# Patient Record
Sex: Female | Born: 1935 | Race: White | Hispanic: No | Marital: Married | Smoking: Never smoker
Health system: Southern US, Community
[De-identification: ages and names within clinical notes are randomized; demographics above are authoritative.]

## PROBLEM LIST (undated history)

## (undated) DIAGNOSIS — I10 Essential (primary) hypertension: Secondary | ICD-10-CM

## (undated) DIAGNOSIS — E119 Type 2 diabetes mellitus without complications: Secondary | ICD-10-CM

## (undated) DIAGNOSIS — E079 Disorder of thyroid, unspecified: Secondary | ICD-10-CM

## (undated) HISTORY — PX: TONSILLECTOMY AND ADENOIDECTOMY: SHX28

## (undated) HISTORY — PX: CATARACT EXTRACTION: SUR2

---

## 1997-11-18 ENCOUNTER — Other Ambulatory Visit: Admission: RE | Admit: 1997-11-18 | Discharge: 1997-11-18 | Payer: Self-pay | Admitting: Family Medicine

## 1998-03-15 ENCOUNTER — Ambulatory Visit (HOSPITAL_COMMUNITY): Admission: RE | Admit: 1998-03-15 | Discharge: 1998-03-15 | Payer: Self-pay | Admitting: Gastroenterology

## 1998-08-25 ENCOUNTER — Ambulatory Visit (HOSPITAL_COMMUNITY): Admission: RE | Admit: 1998-08-25 | Discharge: 1998-08-25 | Payer: Self-pay | Admitting: Family Medicine

## 1998-08-25 ENCOUNTER — Encounter: Payer: Self-pay | Admitting: Family Medicine

## 1998-12-08 ENCOUNTER — Other Ambulatory Visit: Admission: RE | Admit: 1998-12-08 | Discharge: 1998-12-08 | Payer: Self-pay | Admitting: Family Medicine

## 1999-11-30 ENCOUNTER — Encounter: Admission: RE | Admit: 1999-11-30 | Discharge: 1999-11-30 | Payer: Self-pay | Admitting: Family Medicine

## 1999-11-30 ENCOUNTER — Encounter: Payer: Self-pay | Admitting: Family Medicine

## 1999-12-05 ENCOUNTER — Other Ambulatory Visit: Admission: RE | Admit: 1999-12-05 | Discharge: 1999-12-05 | Payer: Self-pay | Admitting: Family Medicine

## 2000-12-02 ENCOUNTER — Encounter: Payer: Self-pay | Admitting: Family Medicine

## 2000-12-02 ENCOUNTER — Encounter: Admission: RE | Admit: 2000-12-02 | Discharge: 2000-12-02 | Payer: Self-pay | Admitting: Family Medicine

## 2000-12-20 ENCOUNTER — Other Ambulatory Visit: Admission: RE | Admit: 2000-12-20 | Discharge: 2000-12-20 | Payer: Self-pay | Admitting: Family Medicine

## 2001-12-18 ENCOUNTER — Encounter: Admission: RE | Admit: 2001-12-18 | Discharge: 2001-12-18 | Payer: Self-pay | Admitting: Family Medicine

## 2001-12-18 ENCOUNTER — Encounter: Payer: Self-pay | Admitting: Family Medicine

## 2002-01-16 ENCOUNTER — Other Ambulatory Visit: Admission: RE | Admit: 2002-01-16 | Discharge: 2002-01-16 | Payer: Self-pay | Admitting: Family Medicine

## 2002-06-26 ENCOUNTER — Ambulatory Visit (HOSPITAL_COMMUNITY): Admission: RE | Admit: 2002-06-26 | Discharge: 2002-06-26 | Payer: Self-pay | Admitting: Gastroenterology

## 2002-06-26 ENCOUNTER — Encounter (INDEPENDENT_AMBULATORY_CARE_PROVIDER_SITE_OTHER): Payer: Self-pay | Admitting: *Deleted

## 2002-12-22 ENCOUNTER — Encounter: Payer: Self-pay | Admitting: Family Medicine

## 2002-12-22 ENCOUNTER — Encounter: Admission: RE | Admit: 2002-12-22 | Discharge: 2002-12-22 | Payer: Self-pay | Admitting: Family Medicine

## 2003-02-03 ENCOUNTER — Other Ambulatory Visit: Admission: RE | Admit: 2003-02-03 | Discharge: 2003-02-03 | Payer: Self-pay | Admitting: Family Medicine

## 2003-12-30 ENCOUNTER — Encounter: Admission: RE | Admit: 2003-12-30 | Discharge: 2003-12-30 | Payer: Self-pay | Admitting: Family Medicine

## 2005-01-07 ENCOUNTER — Encounter: Admission: RE | Admit: 2005-01-07 | Discharge: 2005-01-07 | Payer: Self-pay | Admitting: Orthopedic Surgery

## 2005-02-22 ENCOUNTER — Encounter: Admission: RE | Admit: 2005-02-22 | Discharge: 2005-02-22 | Payer: Self-pay | Admitting: Family Medicine

## 2005-03-14 ENCOUNTER — Encounter: Admission: RE | Admit: 2005-03-14 | Discharge: 2005-03-14 | Payer: Self-pay | Admitting: Family Medicine

## 2005-10-03 ENCOUNTER — Encounter: Admission: RE | Admit: 2005-10-03 | Discharge: 2005-10-03 | Payer: Self-pay | Admitting: Family Medicine

## 2006-02-28 ENCOUNTER — Encounter: Admission: RE | Admit: 2006-02-28 | Discharge: 2006-02-28 | Payer: Self-pay | Admitting: Family Medicine

## 2006-07-24 ENCOUNTER — Other Ambulatory Visit: Admission: RE | Admit: 2006-07-24 | Discharge: 2006-07-24 | Payer: Self-pay | Admitting: Family Medicine

## 2007-03-28 ENCOUNTER — Encounter: Admission: RE | Admit: 2007-03-28 | Discharge: 2007-03-28 | Payer: Self-pay | Admitting: Family Medicine

## 2008-02-03 ENCOUNTER — Encounter: Admission: RE | Admit: 2008-02-03 | Discharge: 2008-02-03 | Payer: Self-pay | Admitting: Family Medicine

## 2008-03-29 ENCOUNTER — Encounter: Admission: RE | Admit: 2008-03-29 | Discharge: 2008-03-29 | Payer: Self-pay | Admitting: Family Medicine

## 2008-04-06 ENCOUNTER — Encounter: Admission: RE | Admit: 2008-04-06 | Discharge: 2008-04-06 | Payer: Self-pay | Admitting: Family Medicine

## 2009-05-03 ENCOUNTER — Encounter: Admission: RE | Admit: 2009-05-03 | Discharge: 2009-05-03 | Payer: Self-pay | Admitting: Family Medicine

## 2010-03-26 ENCOUNTER — Encounter: Payer: Self-pay | Admitting: Family Medicine

## 2010-05-04 ENCOUNTER — Other Ambulatory Visit: Payer: Self-pay | Admitting: Family Medicine

## 2010-05-04 DIAGNOSIS — Z1231 Encounter for screening mammogram for malignant neoplasm of breast: Secondary | ICD-10-CM

## 2010-05-11 ENCOUNTER — Ambulatory Visit
Admission: RE | Admit: 2010-05-11 | Discharge: 2010-05-11 | Disposition: A | Discharge status: Home / Self Care | Payer: Medicare Other | Source: Ambulatory Visit | Attending: *Deleted | Admitting: *Deleted

## 2010-05-11 DIAGNOSIS — Z1231 Encounter for screening mammogram for malignant neoplasm of breast: Secondary | ICD-10-CM

## 2010-07-21 NOTE — Op Note (Signed)
Rhonda Singh, Rhonda Singh                         ACCOUNT NO.:  [id number]   MEDICAL RECORD NO.:  [id number]                   PATIENT TYPE:  AMB   LOCATION:  ENDO                                 FACILITY:  [facility]   PHYSICIAN:  Petra Kuba, M.D.                 DATE OF BIRTH:  1930-04-05   DATE OF PROCEDURE:  06/26/2002  DATE OF DISCHARGE:                                 OPERATIVE REPORT   PROCEDURE:  Colonoscopy and polypectomy.   INDICATIONS:  History of colon cancer.  Due for repeat screening.  Consent  was signed after risks, benefits, methods, options were thoroughly discussed  in the past.   PREMEDICATIONS:  Demerol 80 mg, Versed 8 mg.   DESCRIPTION OF PROCEDURE:  Rectal inspection pertinent for external  hemorrhoids, small.  Digital exam was negative.  Pediatric video adjustable  colonoscope was inserted and fairly easily advanced around the colon to the  cecum.  This did require rolling her on her back and some abdominal  pressure.  No obvious abnormality was seen on insertion.  The cecum  was  identified by the appendiceal orifice and the ileocecal valve.  The scope  was slowly withdrawn.  Prep was adequate.  There was some liquid stool that  required washing and suctioning.  On slow withdrawal through the colon in  the ascending and hepatic flexure two edematous folds were seen.  Just to  make sure there were no polyps, cold biopsies of both were obtained and put  in the same container.  Scope was slowly withdrawn.  In the proximal  descending, a tiny polyp was seen and was hot biopsied x1, put in a second  container.  Other than some occasional left-sided diverticula, no additional  polyps were seen and scope was slowly withdrawn back to the rectum.  Once  back in the rectum, the scope was retroflexed, pertinent for some internal  hemorrhoids.  The scope was straightened and readvanced a short ways up the  left side of the colon.  Air was suctioned and the scope removed.   The  patient tolerated the procedure well.  There was no obvious immediate  complication.   ENDOSCOPIC DIAGNOSES:  1. Internal and external hemorrhoids.  2. Left occasional diverticula.  3. Tiny small descending polyp, hot biopsied.  4. Two edematous folds in the ascending and hepatic flexure, doubt polyps,     cold biopsied just to be sure.  5. Otherwise within normal limits to the cecum.    PLAN:  Await pathology.  If any of the edematous folds are adenomatous  tissue, will need colonoscopy earlier.  Otherwise recheck in five years if  doing well medically.  Have to see back p.r.n.  Probably use a regular scope  next time and return care to Dr. Dorothe Pea for customary health care  maintenance to include yearly rectals and guaiacs.  Petra Kuba, M.D.    MEM/MEDQ  D:  06/26/2002  T:  06/28/2002  Job:  [id number]   cc:   Jethro Bastos, M.D.  [address]  [place]  [place] [address]  Fax: [phone]

## 2011-05-15 ENCOUNTER — Other Ambulatory Visit: Payer: Self-pay | Admitting: Family Medicine

## 2011-05-15 DIAGNOSIS — Z1231 Encounter for screening mammogram for malignant neoplasm of breast: Secondary | ICD-10-CM

## 2011-05-29 ENCOUNTER — Ambulatory Visit
Admission: RE | Admit: 2011-05-29 | Discharge: 2011-05-29 | Disposition: A | Discharge status: Home / Self Care | Payer: Medicare Other | Source: Ambulatory Visit | Attending: Family Medicine | Admitting: Family Medicine

## 2011-05-29 DIAGNOSIS — Z1231 Encounter for screening mammogram for malignant neoplasm of breast: Secondary | ICD-10-CM

## 2012-05-08 ENCOUNTER — Other Ambulatory Visit: Payer: Self-pay

## 2012-05-08 DIAGNOSIS — Z1231 Encounter for screening mammogram for malignant neoplasm of breast: Secondary | ICD-10-CM

## 2012-06-03 ENCOUNTER — Ambulatory Visit
Admission: RE | Admit: 2012-06-03 | Discharge: 2012-06-03 | Disposition: A | Discharge status: Home / Self Care | Payer: Medicare Other | Source: Ambulatory Visit

## 2012-06-03 DIAGNOSIS — Z1231 Encounter for screening mammogram for malignant neoplasm of breast: Secondary | ICD-10-CM

## 2012-09-11 ENCOUNTER — Emergency Department (HOSPITAL_COMMUNITY): Payer: Medicare Other

## 2012-09-11 ENCOUNTER — Inpatient Hospital Stay (HOSPITAL_COMMUNITY)
Admission: EM | Admit: 2012-09-11 | Discharge: 2012-09-15 | DRG: 872 | Disposition: A | Discharge status: Home / Self Care | Payer: Medicare Other | Attending: Internal Medicine | Admitting: Internal Medicine

## 2012-09-11 ENCOUNTER — Encounter (HOSPITAL_COMMUNITY): Payer: Self-pay | Admitting: Emergency Medicine

## 2012-09-11 DIAGNOSIS — A419 Sepsis, unspecified organism: Principal | ICD-10-CM | POA: Diagnosis present

## 2012-09-11 DIAGNOSIS — I10 Essential (primary) hypertension: Secondary | ICD-10-CM | POA: Diagnosis present

## 2012-09-11 DIAGNOSIS — E785 Hyperlipidemia, unspecified: Secondary | ICD-10-CM | POA: Diagnosis present

## 2012-09-11 DIAGNOSIS — E119 Type 2 diabetes mellitus without complications: Secondary | ICD-10-CM | POA: Diagnosis present

## 2012-09-11 DIAGNOSIS — Z8679 Personal history of other diseases of the circulatory system: Secondary | ICD-10-CM

## 2012-09-11 DIAGNOSIS — I959 Hypotension, unspecified: Secondary | ICD-10-CM | POA: Diagnosis present

## 2012-09-11 DIAGNOSIS — N390 Urinary tract infection, site not specified: Secondary | ICD-10-CM | POA: Diagnosis present

## 2012-09-11 DIAGNOSIS — E039 Hypothyroidism, unspecified: Secondary | ICD-10-CM | POA: Diagnosis present

## 2012-09-11 DIAGNOSIS — R009 Unspecified abnormalities of heart beat: Secondary | ICD-10-CM | POA: Diagnosis present

## 2012-09-11 DIAGNOSIS — E669 Obesity, unspecified: Secondary | ICD-10-CM | POA: Diagnosis present

## 2012-09-11 DIAGNOSIS — N179 Acute kidney failure, unspecified: Secondary | ICD-10-CM | POA: Diagnosis present

## 2012-09-11 DIAGNOSIS — E876 Hypokalemia: Secondary | ICD-10-CM | POA: Diagnosis not present

## 2012-09-11 DIAGNOSIS — A498 Other bacterial infections of unspecified site: Secondary | ICD-10-CM | POA: Diagnosis present

## 2012-09-11 DIAGNOSIS — E860 Dehydration: Secondary | ICD-10-CM | POA: Diagnosis present

## 2012-09-11 HISTORY — DX: Disorder of thyroid, unspecified: E07.9

## 2012-09-11 HISTORY — DX: Essential (primary) hypertension: I10

## 2012-09-11 LAB — CBC WITH DIFFERENTIAL/PLATELET
Basophils Relative: 0 % (ref 0–1)
Eosinophils Absolute: 0 10*3/uL (ref 0.0–0.7)
Eosinophils Relative: 0 % (ref 0–5)
Lymphs Abs: 0.4 10*3/uL — ABNORMAL LOW (ref 0.7–4.0)
MCH: 32.6 pg (ref 26.0–34.0)
MCHC: 33.9 g/dL (ref 30.0–36.0)
MCV: 96.1 fL (ref 78.0–100.0)
Monocytes Relative: 4 % (ref 3–12)
Neutrophils Relative %: 89 % — ABNORMAL HIGH (ref 43–77)
Platelets: 148 10*3/uL — ABNORMAL LOW (ref 150–400)
RBC: 3.56 MIL/uL — ABNORMAL LOW (ref 3.87–5.11)

## 2012-09-11 LAB — URINE MICROSCOPIC-ADD ON

## 2012-09-11 LAB — COMPREHENSIVE METABOLIC PANEL
Albumin: 2.8 g/dL — ABNORMAL LOW (ref 3.5–5.2)
BUN: 23 mg/dL (ref 6–23)
Calcium: 8.8 mg/dL (ref 8.4–10.5)
GFR calc Af Amer: 52 mL/min — ABNORMAL LOW (ref >90)
Glucose, Bld: 163 mg/dL — ABNORMAL HIGH (ref 70–99)
Potassium: 3.3 mEq/L — ABNORMAL LOW (ref 3.5–5.1)
Sodium: 142 mEq/L (ref 135–145)
Total Protein: 5.5 g/dL — ABNORMAL LOW (ref 6.0–8.3)

## 2012-09-11 LAB — PROCALCITONIN: Procalcitonin: 0.42 ng/mL

## 2012-09-11 LAB — URINALYSIS, ROUTINE W REFLEX MICROSCOPIC
Nitrite: POSITIVE — AB
Specific Gravity, Urine: 1.016 (ref 1.005–1.030)
Urobilinogen, UA: 1 mg/dL (ref 0.0–1.0)
pH: 6 (ref 5.0–8.0)

## 2012-09-11 MED ORDER — ENOXAPARIN SODIUM 40 MG/0.4ML ~~LOC~~ SOLN
40.0000 mg | SUBCUTANEOUS | Status: DC
  Administered 2012-09-11 – 2012-09-14 (×4): 40 mg via SUBCUTANEOUS
  Filled 2012-09-11 (×5): qty 0.4

## 2012-09-11 MED ORDER — SODIUM CHLORIDE 0.9 % IV SOLN
1000.0000 mL | Freq: Once | INTRAVENOUS | Status: AC
  Administered 2012-09-11: 1000 mL via INTRAVENOUS

## 2012-09-11 MED ORDER — INSULIN ASPART 100 UNIT/ML ~~LOC~~ SOLN: 0.0000 [IU] | Freq: Every day | SUBCUTANEOUS | Status: DC

## 2012-09-11 MED ORDER — DEXTROSE 5 % IV SOLN
1.0000 g | INTRAVENOUS | Status: DC
  Administered 2012-09-12 – 2012-09-14 (×3): 1 g via INTRAVENOUS
  Filled 2012-09-11 (×4): qty 10

## 2012-09-11 MED ORDER — ACETAMINOPHEN 325 MG PO TABS
650.0000 mg | ORAL_TABLET | Freq: Four times a day (QID) | ORAL | Status: DC | PRN
  Administered 2012-09-14: 650 mg via ORAL
  Filled 2012-09-11: qty 2

## 2012-09-11 MED ORDER — LEVOTHYROXINE SODIUM 100 MCG PO TABS
100.0000 ug | ORAL_TABLET | Freq: Every day | ORAL | Status: DC
  Administered 2012-09-12 – 2012-09-15 (×4): 100 ug via ORAL
  Filled 2012-09-11 (×5): qty 1

## 2012-09-11 MED ORDER — ONDANSETRON HCL 4 MG/2ML IJ SOLN: 4.0000 mg | Freq: Four times a day (QID) | INTRAMUSCULAR | Status: DC | PRN

## 2012-09-11 MED ORDER — SODIUM CHLORIDE 0.9 % IV SOLN
1000.0000 mL | INTRAVENOUS | Status: DC
  Administered 2012-09-11 – 2012-09-12 (×3): 1000 mL via INTRAVENOUS

## 2012-09-11 MED ORDER — SODIUM CHLORIDE 0.9 % IV SOLN: 1000.0000 mL | Freq: Once | INTRAVENOUS | Status: AC

## 2012-09-11 MED ORDER — DEXTROSE 5 % IV SOLN: 1.0000 g | INTRAVENOUS | Status: DC

## 2012-09-11 MED ORDER — SODIUM CHLORIDE 0.9 % IJ SOLN
3.0000 mL | Freq: Two times a day (BID) | INTRAMUSCULAR | Status: DC
  Administered 2012-09-11 – 2012-09-14 (×5): 3 mL via INTRAVENOUS

## 2012-09-11 MED ORDER — DEXTROSE 5 % IV SOLN
2.0000 g | Freq: Once | INTRAVENOUS | Status: AC
  Administered 2012-09-11: 2 g via INTRAVENOUS
  Filled 2012-09-11 (×2): qty 2

## 2012-09-11 MED ORDER — POTASSIUM CHLORIDE CRYS ER 20 MEQ PO TBCR
40.0000 meq | EXTENDED_RELEASE_TABLET | Freq: Every day | ORAL | Status: DC
  Administered 2012-09-12: 40 meq via ORAL
  Filled 2012-09-11 (×2): qty 2

## 2012-09-11 MED ORDER — ACETAMINOPHEN 325 MG PO TABS
650.0000 mg | ORAL_TABLET | Freq: Once | ORAL | Status: AC
  Administered 2012-09-11: 650 mg via ORAL
  Filled 2012-09-11: qty 2

## 2012-09-11 MED ORDER — ACETAMINOPHEN 650 MG RE SUPP: 650.0000 mg | Freq: Four times a day (QID) | RECTAL | Status: DC | PRN

## 2012-09-11 MED ORDER — INSULIN ASPART 100 UNIT/ML ~~LOC~~ SOLN
0.0000 [IU] | Freq: Three times a day (TID) | SUBCUTANEOUS | Status: DC
  Administered 2012-09-12: 3 [IU] via SUBCUTANEOUS

## 2012-09-11 MED ORDER — ASPIRIN EC 81 MG PO TBEC
81.0000 mg | DELAYED_RELEASE_TABLET | Freq: Every day | ORAL | Status: DC
  Administered 2012-09-11 – 2012-09-14 (×4): 81 mg via ORAL
  Filled 2012-09-11 (×5): qty 1

## 2012-09-11 MED ORDER — ONDANSETRON HCL 4 MG PO TABS: 4.0000 mg | ORAL_TABLET | Freq: Four times a day (QID) | ORAL | Status: DC | PRN

## 2012-09-11 MED ORDER — DOCUSATE SODIUM 100 MG PO CAPS
100.0000 mg | ORAL_CAPSULE | Freq: Two times a day (BID) | ORAL | Status: DC
  Filled 2012-09-11 (×9): qty 1

## 2012-09-11 MED ORDER — EZETIMIBE 10 MG PO TABS
10.0000 mg | ORAL_TABLET | Freq: Every day | ORAL | Status: DC
  Administered 2012-09-11 – 2012-09-14 (×4): 10 mg via ORAL
  Filled 2012-09-11 (×5): qty 1

## 2012-09-11 MED ORDER — SODIUM CHLORIDE 0.9 % IV BOLUS (SEPSIS)
1000.0000 mL | Freq: Once | INTRAVENOUS | Status: AC
  Administered 2012-09-11: 1000 mL via INTRAVENOUS

## 2012-09-11 MED ORDER — ATORVASTATIN CALCIUM 40 MG PO TABS
40.0000 mg | ORAL_TABLET | Freq: Every day | ORAL | Status: DC
  Administered 2012-09-11 – 2012-09-14 (×4): 40 mg via ORAL
  Filled 2012-09-11 (×5): qty 1

## 2012-09-11 NOTE — ED Notes (Signed)
Returned from xray

## 2012-09-11 NOTE — ED Notes (Signed)
Pt arrives from home via EMS.  Pt reports sudden onset of chills and shaking, feeling freezing.  EMS states pt was hot to touch. 18g left ac, EMS gave 500ns bolus.  EMS tempanic temp 104.0. Hr 120s but rerturned to 100 after fluid bolus, bp 120/60.  Pt alert oriented X4

## 2012-09-11 NOTE — ED Notes (Signed)
Daughter-- Osborne Oman-- HOME --[phone]  Cell-- [phone]

## 2012-09-11 NOTE — H&P (Signed)
[facility] History and Physical  MAKINZEY BANES AVW:[id number] DOB: 10/20/1930    PCP:   None  Chief Complaint: Rigors.  HPI: Rhonda Singh is an 79 y.o. female with hx of HTN on meds, hypothyroidism on supplements, obesity, presents to the ER with chills and rigors for one day.  She was having dysuria a few days ago, and has been taking OTC. Today, however, there were no dysuria, N/V, shortness of breath or chest pain, flank pain, hematuria or any other symptomology except chills and rigors.  Evaluation in the ER showed UA with TNTC WBC, with normal WBC and normal renal fx tests.  Her K is a little low at 3.3.  PCCM was consulted and hospitalist was asked to admit her for urosepsis.  She responded to some IVF bolus to a SBP of 88.  She mentated well and has no sign of peripheral hypoperfusion.  Rewiew of Systems:  Constitutional: Negative for malaise.  No significant weight loss or weight gain Eyes: Negative for eye pain, redness and discharge, diplopia, visual changes, or flashes of light. ENMT: Negative for ear pain, hoarseness, nasal congestion, sinus pressure and sore throat. No headaches; tinnitus, drooling, or problem swallowing. Cardiovascular: Negative for chest pain, palpitations, diaphoresis, dyspnea and peripheral edema. ; No orthopnea, PND Respiratory: Negative for cough, hemoptysis, wheezing and stridor. No pleuritic chestpain. Gastrointestinal: Negative for nausea, vomiting, diarrhea, constipation, abdominal pain, melena, blood in stool, hematemesis, jaundice and rectal bleeding.    Genitourinary: Negative for frequency, dysuria, incontinence,flank pain and hematuria; Musculoskeletal: Negative for back pain and neck pain. Negative for swelling and trauma.;  Skin: . Negative for pruritus, rash, abrasions, bruising and skin lesion.; ulcerations Neuro: Negative for headache, lightheadedness and neck stiffness. Negative for weakness, altered level of consciousness ,  altered mental status, extremity weakness, burning feet, involuntary movement, seizure and syncope.  Psych: negative for anxiety, depression, insomnia, tearfulness, panic attacks, hallucinations, paranoia, suicidal or homicidal ideation.   Past Medical History  Diagnosis Date  . Hypertension   . Thyroid disease     No past surgical history on file.  Medications:  HOME MEDS: Prior to Admission medications   Medication Sig Start Date End Date Taking? Authorizing Provider  aspirin EC 81 MG tablet Take 81 mg by mouth at bedtime.   Yes Historical Provider, MD  cloNIDine (CATAPRES) 0.2 MG tablet Take 0.2 mg by mouth at bedtime.   Yes Historical Provider, MD  ezetimibe (ZETIA) 10 MG tablet Take 10 mg by mouth at bedtime.   Yes Historical Provider, MD  levothyroxine (SYNTHROID, LEVOTHROID) 100 MCG tablet Take 100 mcg by mouth daily before breakfast.   Yes Historical Provider, MD  lisinopril-hydrochlorothiazide (PRINZIDE,ZESTORETIC) 20-25 MG per tablet Take 1 tablet by mouth every morning.   Yes Historical Provider, MD  Multiple Vitamin (MULTIVITAMIN WITH MINERALS) TABS Take 1 tablet by mouth daily.   Yes Historical Provider, MD  Naphazoline HCl (CLEAR EYES OP) Apply 1 drop to eye daily as needed. For dry eyes   Yes Historical Provider, MD  naproxen sodium (ANAPROX) 220 MG tablet Take 220 mg by mouth daily as needed. For pain   Yes Historical Provider, MD  rosuvastatin (CRESTOR) 20 MG tablet Take 20 mg by mouth at bedtime.   Yes Historical Provider, MD     Allergies:  No Known Allergies  Social History:   has no tobacco, alcohol, and drug history on file.  Family History: No family history on file.   Physical Exam: Filed Vitals:  09/11/12 1800 09/11/12 1815 09/11/12 1830 09/11/12 2017  BP: 87/39 95/39 82/62  92/50  Pulse: 73 83 75 68  Temp:    98.4 F (36.9 C)  TempSrc:    Oral  Resp: 20 27 21 18   SpO2: 96% 95% 97% 98%   Blood pressure 92/50, pulse 68, temperature 98.4 F  (36.9 C), temperature source Oral, resp. rate 18, SpO2 98.00%.  GEN:  Pleasant  patient lying in the stretcher in no acute distress; cooperative with exam. PSYCH:  alert and oriented x4; does not appear anxious or depressed; affect is appropriate. HEENT: Mucous membranes pink and anicteric; PERRLA; EOM intact; no cervical lymphadenopathy nor thyromegaly or carotid bruit; no JVD; There were no stridor. Neck is very supple. Breasts:: Not examined CHEST WALL: No tenderness CHEST: Normal respiration, clear to auscultation bilaterally.  HEART: Regular rate and rhythm.  There are no murmur, rub, or gallops.   BACK: No kyphosis or scoliosis; no CVA tenderness ABDOMEN: soft and non-tender; no masses, no organomegaly, normal abdominal bowel sounds; no pannus; no intertriginous candida. There is no rebound and no distention. Rectal Exam: Not done EXTREMITIES: No bone or joint deformity; age-appropriate arthropathy of the hands and knees; no edema; no ulcerations.  There is no calf tenderness. Genitalia: not examined PULSES: 2+ and symmetric SKIN: Normal hydration no rash or ulceration CNS: Cranial nerves 2-12 grossly intact no focal lateralizing neurologic deficit.  Speech is fluent; uvula elevated with phonation, facial symmetry and tongue midline. DTR are normal bilaterally, cerebella exam is intact, barbinski is negative and strengths are equaled bilaterally.  No sensory loss.   Labs on Admission:  Basic Metabolic Panel:  Recent Labs Lab 09/11/12 1539  NA 142  K 3.3*  CL 106  CO2 24  GLUCOSE 163*  BUN 23  CREATININE 1.12*  CALCIUM 8.8   Liver Function Tests:  Recent Labs Lab 09/11/12 1539  AST 27  ALT 25  ALKPHOS 54  BILITOT 0.6  PROT 5.5*  ALBUMIN 2.8*   No results found for this basename: LIPASE, AMYLASE,  in the last 168 hours No results found for this basename: AMMONIA,  in the last 168 hours CBC:  Recent Labs Lab 09/11/12 1539  WBC 5.7  NEUTROABS 5.0  HGB 11.6*   HCT 34.2*  MCV 96.1  PLT 148*   Cardiac Enzymes: No results found for this basename: CKTOTAL, CKMB, CKMBINDEX, TROPONINI,  in the last 168 hours  CBG: No results found for this basename: GLUCAP,  in the last 168 hours   Radiological Exams on Admission: Dg Chest 2 View  09/11/2012   *RADIOLOGY REPORT*  Clinical Data: History of fever and weakness.  CHEST - 2 VIEW  Comparison: 02/03/2008 study.  Findings: Cardiac silhouette is upper range of normal size. Ectasia and nonaneurysmal calcification of the thoracic aorta are seen.  No hilar enlargement is seen.  No pulmonary infiltrates or masses were evident. No pleural abnormality is evident. There is osteopenic appearance of the bones. There is minimal degenerative spondylosis.  IMPRESSION: No acute cardiopulmonary or pleural abnormalities are evident.   Original Report Authenticated By: Onalee Hua Call    Assessment/Plan Present on Admission:  . Urosepsis . Hypotension arterial . DM2 (diabetes mellitus, type 2) . Obesity . Thyroid condition . Hyperlipidemia  PLAN:  Will admit her to SDU for urosepsis due to hypotension.  Will continue with Rocephin.  BC and urine cultures have been obtain.  Will give her IVF, and Zesteretic will be discontinued.  For her DM,  will use ISS with q ac and hs CBGs.  For her hypothyroidism, will continue supplement and check TSH.  Her meds have been continued.  Thank you for allowing me to participate in the care of this pleasant patient.  Other plans as per orders.  Code Status: FULL Unk Lightning, MD. [facility] Pager [phone] 7pm to 7am.  09/11/2012, 9:00 PM

## 2012-09-11 NOTE — ED Provider Notes (Signed)
I saw and evaluated the patient, reviewed the resident's note and I agree with the findings and plan.  79yo female with history of hypertension has several days of some urinary frequency with some dysuria today of shaking chills and came to the emergency department with fever to 104 and pulse 120 prior to arrival with pulse decreasing with IV fluid bolus from EMS and systolic blood pressure in the 90s upon initial evaluation with no abdominal pain or back pain.  Transient asymptomatic hypotension with systolic pressure in the ED is in the emergency department back up to the 90s with IV fluid bolus. 1820  D/w PCCM rec stepdown admit to [facility].1830  D/w [facility] for admit. 2005  CRITICAL CARE Performed by: Hurman Horn Total critical care time: including treatment of hypotension. Critical care time was exclusive of separately billable procedures and treating other patients. Critical care was necessary to treat or prevent imminent or life-threatening deterioration. Critical care was time spent personally by me on the following activities: development of treatment plan with patient and/or surrogate as well as nursing, discussions with consultants, evaluation of patient's response to treatment, examination of patient, obtaining history from patient or surrogate, ordering and performing treatments and interventions, ordering and review of laboratory studies, ordering and review of radiographic studies, pulse oximetry and re-evaluation of patient's condition.  Auto BP cuff did not correlate with manual BP in ED; when auto SBP 79 manual SBP was 92.  Hurman Horn, MD 09/13/12 2103

## 2012-09-11 NOTE — ED Notes (Signed)
States "I felt fine this morning, and just started getting chilly this afternoon" Dr. Fonnie Jarvis in to see pt.

## 2012-09-11 NOTE — ED Provider Notes (Signed)
History    CSN: [id number] Arrival date & time 09/11/12  1433  First MD Initiated Contact with Patient 09/11/12 1446     Chief Complaint  Patient presents with  . Fever    HPI  Pt is a 79 yo Caucasian F with pmh of HTN, hypothyroidism on replacement, and HL who presents with persistent rigors that began 1 day ago. Pt reports last night she had shaking chills that resolved when she put on more clothing. She then started having similar shaking chills today during lunch time, about 3 hours ago. She feels that she is gasping for breath when having rigors. She has been having urinary burning and frequency with no blood in urine for about 1 week and has been taking OTC Urostat. She has had urinary symptoms on and off for many years and which she usually resolves when takes OTC medication. No h/o nephrolithiasis.    No CP, HA, neck stiffness, mental confusion, abdominal pain, nausea, vomiting, decreased appetite, or changes in BM.  Has chronic dry cough thought to be due to GERD, with no recent changes. Also scraped right leg in her garden over a month ago but is not infected and is resolving.     Past Medical History  Diagnosis Date  . Hypertension   . Thyroid disease    No past surgical history on file. No family history on file. History  Substance Use Topics  . Smoking status: Not on file  . Smokeless tobacco: Not on file  . Alcohol Use: Not on file   OB History   Grav Para Term Preterm Abortions TAB SAB Ect Mult Living                 Review of Systems  Constitutional: Positive for fever and chills. Negative for appetite change.  HENT: Negative for congestion and sneezing.   Respiratory: Positive for cough (chronic dry cough). Negative for shortness of breath.        Gasping when having rigors   Cardiovascular: Negative for chest pain and leg swelling.  Gastrointestinal: Negative for vomiting, abdominal pain, diarrhea and constipation.  Genitourinary: Positive for  frequency. Negative for hematuria and flank pain.       Burning on urination   Musculoskeletal: Negative for back pain.  Skin:       Scab on right LE   Neurological: Negative for headaches.  Psychiatric/Behavioral: Negative for confusion.    Allergies  Review of patient's allergies indicates no known allergies.  Home Medications   Current Outpatient Rx  Name  Route  Sig  Dispense  Refill  . aspirin EC 81 MG tablet   Oral   Take 81 mg by mouth at bedtime.         . cloNIDine (CATAPRES) 0.2 MG tablet   Oral   Take 0.2 mg by mouth at bedtime.         Marland Kitchen ezetimibe (ZETIA) 10 MG tablet   Oral   Take 10 mg by mouth at bedtime.         Marland Kitchen levothyroxine (SYNTHROID, LEVOTHROID) 100 MCG tablet   Oral   Take 100 mcg by mouth daily before breakfast.         . lisinopril-hydrochlorothiazide (PRINZIDE,ZESTORETIC) 20-25 MG per tablet   Oral   Take 1 tablet by mouth every morning.         . Multiple Vitamin (MULTIVITAMIN WITH MINERALS) TABS   Oral   Take 1 tablet by mouth daily.         Marland Kitchen  Naphazoline HCl (CLEAR EYES OP)   Ophthalmic   Apply 1 drop to eye daily as needed. For dry eyes         . naproxen sodium (ANAPROX) 220 MG tablet   Oral   Take 220 mg by mouth daily as needed. For pain         . rosuvastatin (CRESTOR) 20 MG tablet   Oral   Take 20 mg by mouth at bedtime.          BP 92/50  Pulse 68  Temp(Src) 98.4 F (36.9 C) (Oral)  Resp 18  SpO2 98% Physical Exam  Constitutional: She is oriented to person, place, and time. She appears well-developed and well-nourished. No distress.  Obese Warm to touch  HENT:  Head: Normocephalic and atraumatic.  Eyes: EOM are normal.  Neck: Normal range of motion. Neck supple.  Cardiovascular: Regular rhythm and normal heart sounds.   Tachycardic   Pulmonary/Chest: Breath sounds normal. No respiratory distress. She has no wheezes. She has no rales.  tachypneic   Abdominal: Soft. Bowel sounds are normal. She  exhibits no distension. There is no tenderness. There is no rebound, no guarding and no CVA tenderness.  Musculoskeletal: Normal range of motion. She exhibits edema (trace LE edema).  Neurological: She is alert and oriented to person, place, and time.  Skin: Skin is warm and dry. She is not diaphoretic.  Healing scab on right LE above ankle  Psychiatric: She has a normal mood and affect.    ED Course  Procedures (including critical care time) Labs Reviewed  CBC WITH DIFFERENTIAL - Abnormal; Notable for the following:    RBC 3.56 (*)    Hemoglobin 11.6 (*)    HCT 34.2 (*)    Platelets 148 (*)    Neutrophils Relative % 89 (*)    Lymphocytes Relative 7 (*)    Lymphs Abs 0.4 (*)    All other components within normal limits  COMPREHENSIVE METABOLIC PANEL - Abnormal; Notable for the following:    Potassium 3.3 (*)    Glucose, Bld 163 (*)    Creatinine, Ser 1.12 (*)    Total Protein 5.5 (*)    Albumin 2.8 (*)    GFR calc non Af Amer 45 (*)    GFR calc Af Amer 52 (*)    All other components within normal limits  URINALYSIS, ROUTINE W REFLEX MICROSCOPIC - Abnormal; Notable for the following:    Color, Urine AMBER (*)    APPearance CLOUDY (*)    Hgb urine dipstick SMALL (*)    Bilirubin Urine SMALL (*)    Protein, ur 30 (*)    Nitrite POSITIVE (*)    Leukocytes, UA LARGE (*)    All other components within normal limits  URINE MICROSCOPIC-ADD ON - Abnormal; Notable for the following:    Bacteria, UA MANY (*)    All other components within normal limits  CULTURE, BLOOD (ROUTINE X 2)  CULTURE, BLOOD (ROUTINE X 2)  URINE CULTURE  PROCALCITONIN  CG4 I-STAT (LACTIC ACID)   Dg Chest 2 View  09/11/2012   *RADIOLOGY REPORT*  Clinical Data: History of fever and weakness.  CHEST - 2 VIEW  Comparison: 02/03/2008 study.  Findings: Cardiac silhouette is upper range of normal size. Ectasia and nonaneurysmal calcification of the thoracic aorta are seen.  No hilar enlargement is seen.  No  pulmonary infiltrates or masses were evident. No pleural abnormality is evident. There is osteopenic appearance of the bones. There  is minimal degenerative spondylosis.  IMPRESSION: No acute cardiopulmonary or pleural abnormalities are evident.   Original Report Authenticated By: Onalee Hua Call   1. Sepsis   2. UTI (urinary tract infection)     MDM  Assessment:  79 yo Caucasian F with pmh of HTN, hypothyroidism on replacement, and HL who presents with rigors, initial fever of104, and urinary symptoms.   Plan:   Rigors, fever  - UTI w/ sepsis vs pyelonephritis vs nephrolithiasis  -Obtain CBC w/ diff ---> microcytic anemia  -Obtain CMP ---> Hyperglycemia, elevated Cr,  hypokalemia --> replete with PO potassium   -Obtain Procalcitonin levels --->  0.42 --> sepsis not likely, local bacterial infection is possible -Obtain lactic acid levels --> WNL -Obtain blood cultures x 2 --> pending  -Obtain UA w/ culture -->  Cloudy with pyuria, bacteriuria, nitrite+, and dipstick+, no casts  --> c/w UTI vs pyelonephritis     -Obtain CXR --> normal  -Monitor with VS with cardiac monitor & pulse oximetry  -[place] NS bolus & 159mL/hr to maintain appropiate BP -Administer Rocephin 2 g IV for suspected UTI  Disposition: Admit to inpatient medicine. Pt with sepsis (SIRS + suspected UTI/pyelonephritis). Rigors subsided, no CVA tenderness  or leukocytosis, but  was febrile, tachycardic, and hypotensive that improved with IV fluids. Automatic BP cuff readings falsely low (SBP 79 vs manual cuff SBP 92).  Was started on IV antibiotics, awaiting urine and blood cultures.                   Otis Brace, MD 09/11/12 2022

## 2012-09-11 NOTE — ED Notes (Addendum)
Low BP noted Dr. Johna Roles informed. Manual BP taken

## 2012-09-12 DIAGNOSIS — E876 Hypokalemia: Secondary | ICD-10-CM | POA: Diagnosis not present

## 2012-09-12 DIAGNOSIS — R009 Unspecified abnormalities of heart beat: Secondary | ICD-10-CM | POA: Diagnosis present

## 2012-09-12 DIAGNOSIS — E860 Dehydration: Secondary | ICD-10-CM | POA: Diagnosis present

## 2012-09-12 DIAGNOSIS — N390 Urinary tract infection, site not specified: Secondary | ICD-10-CM | POA: Diagnosis present

## 2012-09-12 LAB — GLUCOSE, CAPILLARY

## 2012-09-12 LAB — MRSA PCR SCREENING: MRSA by PCR: NEGATIVE

## 2012-09-12 MED ORDER — SODIUM CHLORIDE 0.9 % IV SOLN
1000.0000 mL | INTRAVENOUS | Status: DC
  Administered 2012-09-13: 1000 mL via INTRAVENOUS

## 2012-09-12 NOTE — Progress Notes (Signed)
Utilization Review Completed.  09/12/2012  

## 2012-09-12 NOTE — Progress Notes (Signed)
[facility] Progress Note Holiday TEAM 1 - Stepdown/ICU TEAM   Rhonda Singh ZOX:[id number] DOB: Nov 01, 1930 DOA: 09/11/2012 PCP: No primary provider on file.  Brief narrative: 79 year old female patient essentially healthy. Presented to the ER with acute onset of chills and rigors for less than 24 hours. Apparent dysuria for a few days preceding this. She had been treating this with over-the-counter medications. She had no other symptoms. In the emergency department her urinalysis was abnormal with too numerous to count white cells. Her serum white count was normal. She was hypotensive at presentation and required multiple boluses of IV fluids and at time of admission her systolic blood pressure had increased to 88. She was mentally intact and otherwise had no signs of hypoperfusion although her MAP was less than 65.  Assessment/Plan:  Sepsis / Hypotension -BP still soft so cont IVF -suspect urinary etiology -cont supportive care and follow up on all cultures  UTI (lower urinary tract infection) -culture pending -cont empiric anbx's  DM2  -CBG controlled -cont SSI -diet controlled at home  Hypokalemia -oral replete and follow up BMET in am  Dehydration -S/P 3 liters infused since admit  -cont IVF since BP soft  ARF vs CKD 3 -GFR in the 50's but baseline unknown -follow  Abnormal cardiac rythmn -having a few PVC couplets and did have a sinus pause with a dropped QRS/nonconducted P wave -follow on telemetry  History of hypertension -home Catapres and Prinizide on hold  Obesity  Hypothyroidism -continue Synthroid  Hyperlipidemia   DVT prophylaxis: Lovenox Code Status: Full Family Communication: Patient only Disposition Plan: Remain in step down 2/2 soft blood pressure and low MAP  Consultants: None  Procedures: None  Antibiotics: Rocephin 7/10 >>>  HPI/Subjective: Patient alert and denies abdominal or flank pain. No nausea or vomiting. No  dysuria.  Objective: Blood pressure 97/47, pulse 70, temperature 99.1 F (37.3 C), temperature source Oral, resp. rate 19, height 5\' 4"  (1.626 m), weight 87.9 kg (193 lb 12.6 oz), SpO2 96.00%.  Intake/Output Summary (Last 24 hours) at 09/12/12 1228 Last data filed at 09/12/12 1100  Gross per 24 hour  Intake   4750 ml  Output    750 ml  Net   4000 ml   Exam: General: No acute respiratory distress Lungs: Clear to auscultation bilaterally without wheezes or crackles, RA Cardiovascular: Mostly Regular rate and rhythm without murmur gallop or rub normal S1 and S2, no peripheral edema or JVD-patient noted with occasional unifocal PVC couplets and did have at least one isolated incidents of a dropped QRS/nonconducted P-wave Abdomen: Nontender, nondistended, soft, bowel sounds positive, no rebound, no ascites, no appreciable mass Musculoskeletal: No significant cyanosis, clubbing of bilateral lower extremities Neurological: Alert and oriented x 3, moves all extremities x 4 without focal neurological deficits, CN 2-12 intact  Scheduled Meds: Scheduled Meds: . aspirin EC  81 mg Oral QHS  . atorvastatin  40 mg Oral q1800  . cefTRIAXone (ROCEPHIN)  IV  1 g Intravenous Q24H  . docusate sodium  100 mg Oral BID  . enoxaparin (LOVENOX) injection  40 mg Subcutaneous Q24H  . ezetimibe  10 mg Oral QHS  . insulin aspart  0-15 Units Subcutaneous TID WC  . insulin aspart  0-5 Units Subcutaneous QHS  . levothyroxine  100 mcg Oral QAC breakfast  . potassium chloride  40 mEq Oral Daily  . sodium chloride  3 mL Intravenous Q12H    Data Reviewed: Basic Metabolic Panel:  Recent Labs Lab  09/11/12 1539  NA 142  K 3.3*  CL 106  CO2 24  GLUCOSE 163*  BUN 23  CREATININE 1.12*  CALCIUM 8.8   Liver Function Tests:  Recent Labs Lab 09/11/12 1539  AST 27  ALT 25  ALKPHOS 54  BILITOT 0.6  PROT 5.5*  ALBUMIN 2.8*   CBC:  Recent Labs Lab 09/11/12 1539  WBC 5.7  NEUTROABS 5.0  HGB 11.6*   HCT 34.2*  MCV 96.1  PLT 148*   CBG:  Recent Labs Lab 09/11/12 2228 09/12/12 0815  GLUCAP 152* 99    Recent Results (from the past 240 hour(s))  CULTURE, BLOOD (ROUTINE X 2)     Status: None   Collection Time    09/11/12  3:51 PM      Result Value Range Status   Specimen Description BLOOD WRIST RIGHT   Final   Special Requests BOTTLES DRAWN AEROBIC ONLY 8CC   Final   Culture  Setup Time 09/11/2012 23:13   Final   Culture     Final   Value:        BLOOD CULTURE RECEIVED NO GROWTH TO DATE CULTURE WILL BE HELD FOR 5 DAYS BEFORE ISSUING A FINAL NEGATIVE REPORT   Report Status PENDING   Incomplete  CULTURE, BLOOD (ROUTINE X 2)     Status: None   Collection Time    09/11/12  4:05 PM      Result Value Range Status   Specimen Description BLOOD HAND LEFT   Final   Special Requests BOTTLES DRAWN AEROBIC ONLY [facility]   Final   Culture  Setup Time 09/11/2012 23:13   Final   Culture     Final   Value:        BLOOD CULTURE RECEIVED NO GROWTH TO DATE CULTURE WILL BE HELD FOR 5 DAYS BEFORE ISSUING A FINAL NEGATIVE REPORT   Report Status PENDING   Incomplete  MRSA PCR SCREENING     Status: None   Collection Time    09/11/12  9:38 PM      Result Value Range Status   MRSA by PCR NEGATIVE  NEGATIVE Final   Comment:            The GeneXpert MRSA Assay (FDA     approved for NASAL specimens     only), is one component of a     comprehensive MRSA colonization     surveillance program. It is not     intended to diagnose MRSA     infection nor to guide or     monitor treatment for     MRSA infections.     Studies:  Recent x-ray studies have been reviewed in detail by the Attending Physician    Junious Silk, ANP [facility] Office  [phone] Pager [phone]  **If unable to reach the above provider after paging please contact the Flow Manager @ [phone]  On-Call/Text Page:      [email or web address]      password [email or web address]  If 7PM-7AM, please contact  night-coverage [email or web address] Password [email or web address] 09/12/2012, 12:28 PM   LOS: 1 day   I have personally examined this patient and reviewed the entire database. I have reviewed the above note, made any necessary editorial changes, and agree with its content.  Lonia Blood, MD [facility]

## 2012-09-13 LAB — BASIC METABOLIC PANEL
CO2: 24 mEq/L (ref 19–32)
Chloride: 112 mEq/L (ref 96–112)
GFR calc Af Amer: 67 mL/min — ABNORMAL LOW (ref >90)
Potassium: 4.2 mEq/L (ref 3.5–5.1)
Sodium: 143 mEq/L (ref 135–145)

## 2012-09-13 LAB — URINE CULTURE

## 2012-09-13 LAB — CBC
HCT: 33.5 % — ABNORMAL LOW (ref 36.0–46.0)
Hemoglobin: 11.2 g/dL — ABNORMAL LOW (ref 12.0–15.0)
MCV: 97.4 fL (ref 78.0–100.0)
RBC: 3.44 MIL/uL — ABNORMAL LOW (ref 3.87–5.11)
WBC: 7.8 10*3/uL (ref 4.0–10.5)

## 2012-09-13 LAB — GLUCOSE, CAPILLARY: Glucose-Capillary: 88 mg/dL (ref 70–99)

## 2012-09-13 MED ORDER — SODIUM CHLORIDE 0.9 % IV SOLN
1000.0000 mL | INTRAVENOUS | Status: DC
  Administered 2012-09-13: 1000 mL via INTRAVENOUS

## 2012-09-13 NOTE — Progress Notes (Addendum)
NURSING PROGRESS NOTE  Rhonda Singh [id number] Transfer Data: 09/13/2012 6:39 PM Attending Provider: Lonia Blood, MD PCP:No primary provider on file. Code Status: full  Rhonda Singh is a 79 y.o. female patient transferred from 27 -No acute distress noted.  -No complaints of shortness of breath.  -No complaints of chest pain.   Cardiac Monitoring: Box # [id number] in place. Cardiac monitor yields:normal sinus rhythm.  Blood pressure 171/75, pulse 78, temperature 98.9 F (37.2 C), temperature source Oral, resp. rate 20, height 5\' 4"  (1.626 m), weight 87.9 kg (193 lb 12.6 oz), SpO2 99.00%.   IV Fluids:  IV in place, occlusive dsg intact without redness, IV cath antecubital left, condition patent and no redness Right FA with normal saline @ 50 patent and no redness.  Allergies:  Review of patient's allergies indicates no known allergies.  Past Medical History:   has a past medical history of Hypertension and Thyroid disease.  Past Surgical History:   has no past surgical history on file.  Social History:     Skin: warm dry intact  Patient/Family orientated to room. Admission inpatient armband information verified with patient/family to include name and date of birth and placed on patient arm. Side rails up x 2 Patient/family able to verbalize understanding of risk associated with falls and verbalized understanding to call for assistance before getting out of bed. Call light within reach. Patient/family able to voice and demonstrate understanding of unit orientation instructions.    Will continue to evaluate and treat per MD orders.

## 2012-09-13 NOTE — Progress Notes (Signed)
[facility] Progress Note  TEAM 1 - Stepdown/ICU TEAM   BONNA STEURY ZOX:[id number] DOB: [birth year] DOA: 09/11/2012 PCP: No primary provider on file.  Brief narrative: 79 year old female patient essentially healthy. Presented to the ER with acute onset of chills and rigors for less than 24 hours. Apparent dysuria for a few days preceding this. She had been treating this with over-the-counter medications. She had no other symptoms. In the emergency department her urinalysis was abnormal with too numerous to count white cells. Her serum white count was normal. She was hypotensive at presentation and required multiple boluses of IV fluids and at time of admission her systolic blood pressure had increased to 88. She was mentally intact and otherwise had no signs of hypoperfusion although her MAP was less than 65.  Assessment/Plan:  Sepsis / Hypotension - urinary source Clinically resolved with normalization of blood pressure  Escherichia coli UTI  cont empiric abx's - followup sensitivities when available  DM2  CBG is reasonably controlled at the present time - follow trend without change today - is on diet control alone at home  Hypokalemia Resolved with repletion  Dehydration Resolved with volume resuscitation  Acute kidney injury Creatinine has improved markedly with volume resuscitation  Abnormal cardiac rythmn Telemetry monitoring revealed a few PVC couplets and a sinus pause with a dropped QRS/nonconducted P wave - follow on telemetry  History of hypertension Will likely need to resume home blood pressure meds within the next 24 hours  Obesity  Hypothyroidism continue Synthroid  Hyperlipidemia Continue home Crestor  DVT prophylaxis: Lovenox Code Status: Full Family Communication: Patient only Disposition Plan: Transfer to telemetry bed  Consultants: None  Procedures: None  Antibiotics: Rocephin 7/10 >>>  HPI/Subjective: Patient feels  much better today.  She denies nausea vomiting shortness of breath lightheadedness or dizziness.  Objective: Blood pressure 125/48, pulse 75, temperature 98.1 F (36.7 C), temperature source Oral, resp. rate 19, height 5\' 4"  (1.626 m), weight 87.9 kg (193 lb 12.6 oz), SpO2 96.00%.  Intake/Output Summary (Last 24 hours) at 09/13/12 1440 Last data filed at 09/13/12 0900  Gross per 24 hour  Intake   1800 ml  Output   1000 ml  Net    800 ml   Exam: General: No acute respiratory distress Lungs: Clear to auscultation bilaterally without wheezes or crackles Cardiovascular: Regular rate and rhythm at present time without appreciable murmur gallop or rub Abdomen: Nontender, nondistended, soft, bowel sounds positive, no rebound, no ascites, no appreciable mass Musculoskeletal: No significant cyanosis, clubbing of bilateral lower extremities Neurological: Alert and oriented x 3, CN 2-12 intact  Scheduled Meds: Scheduled Meds: . aspirin EC  81 mg Oral QHS  . atorvastatin  40 mg Oral q1800  . cefTRIAXone (ROCEPHIN)  IV  1 g Intravenous Q24H  . docusate sodium  100 mg Oral BID  . enoxaparin (LOVENOX) injection  40 mg Subcutaneous Q24H  . ezetimibe  10 mg Oral QHS  . insulin aspart  0-15 Units Subcutaneous TID WC  . insulin aspart  0-5 Units Subcutaneous QHS  . levothyroxine  100 mcg Oral QAC breakfast  . potassium chloride  40 mEq Oral Daily  . sodium chloride  3 mL Intravenous Q12H    Data Reviewed: Basic Metabolic Panel:  Recent Labs Lab 09/11/12 1539 09/13/12 0610  NA 142 143  K 3.3* 4.2  CL 106 112  CO2 24 24  GLUCOSE 163* 95  BUN 23 18  CREATININE 1.12* 0.91  CALCIUM 8.8 8.9  Liver Function Tests:  Recent Labs Lab 09/11/12 1539  AST 27  ALT 25  ALKPHOS 54  BILITOT 0.6  PROT 5.5*  ALBUMIN 2.8*   CBC:  Recent Labs Lab 09/11/12 1539 09/13/12 0610  WBC 5.7 7.8  NEUTROABS 5.0  --   HGB 11.6* 11.2*  HCT 34.2* 33.5*  MCV 96.1 97.4  PLT 148* 188    CBG:  Recent Labs Lab 09/12/12 1229 09/12/12 1617 09/12/12 2200 09/13/12 0758 09/13/12 1208  GLUCAP 94 153* 121* 88 131*    Recent Results (from the past 240 hour(s))  CULTURE, BLOOD (ROUTINE X 2)     Status: None   Collection Time    09/11/12  3:51 PM      Result Value Range Status   Specimen Description BLOOD WRIST RIGHT   Final   Special Requests BOTTLES DRAWN AEROBIC ONLY 8CC   Final   Culture  Setup Time 09/11/2012 23:13   Final   Culture     Final   Value:        BLOOD CULTURE RECEIVED NO GROWTH TO DATE CULTURE WILL BE HELD FOR 5 DAYS BEFORE ISSUING A FINAL NEGATIVE REPORT   Report Status PENDING   Incomplete  CULTURE, BLOOD (ROUTINE X 2)     Status: None   Collection Time    09/11/12  4:05 PM      Result Value Range Status   Specimen Description BLOOD HAND LEFT   Final   Special Requests BOTTLES DRAWN AEROBIC ONLY [facility]   Final   Culture  Setup Time 09/11/2012 23:13   Final   Culture     Final   Value:        BLOOD CULTURE RECEIVED NO GROWTH TO DATE CULTURE WILL BE HELD FOR 5 DAYS BEFORE ISSUING A FINAL NEGATIVE REPORT   Report Status PENDING   Incomplete  URINE CULTURE     Status: None   Collection Time    09/11/12  4:46 PM      Result Value Range Status   Specimen Description URINE, CATHETERIZED   Final   Special Requests NONE   Final   Culture  Setup Time 09/12/2012 00:42   Final   Colony Count >=100,000 COLONIES/ML   Final   Culture ESCHERICHIA COLI   Final   Report Status PENDING   Incomplete  MRSA PCR SCREENING     Status: None   Collection Time    09/11/12  9:38 PM      Result Value Range Status   MRSA by PCR NEGATIVE  NEGATIVE Final   Comment:            The GeneXpert MRSA Assay (FDA     approved for NASAL specimens     only), is one component of a     comprehensive MRSA colonization     surveillance program. It is not     intended to diagnose MRSA     infection nor to guide or     monitor treatment for     MRSA infections.      Studies:  Recent x-ray studies have been reviewed in detail by the Attending Physician  Lonia Blood, MD [facility] Office  [phone] Pager [phone]  On-Call/Text Page:      [email or web address]      password [email or web address]  If 7PM-7AM, please contact night-coverage [email or web address] Password [email or web address] 09/13/2012, 2:40 PM   LOS: 2 days

## 2012-09-14 ENCOUNTER — Encounter (HOSPITAL_COMMUNITY): Payer: Self-pay | Admitting: *Deleted

## 2012-09-14 DIAGNOSIS — E860 Dehydration: Secondary | ICD-10-CM

## 2012-09-14 DIAGNOSIS — N390 Urinary tract infection, site not specified: Secondary | ICD-10-CM

## 2012-09-14 LAB — GLUCOSE, CAPILLARY
Glucose-Capillary: 105 mg/dL — ABNORMAL HIGH (ref 70–99)
Glucose-Capillary: 105 mg/dL — ABNORMAL HIGH (ref 70–99)
Glucose-Capillary: 131 mg/dL — ABNORMAL HIGH (ref 70–99)

## 2012-09-14 LAB — BASIC METABOLIC PANEL
BUN: 17 mg/dL (ref 6–23)
CO2: 24 mEq/L (ref 19–32)
Chloride: 113 mEq/L — ABNORMAL HIGH (ref 96–112)
Creatinine, Ser: 0.89 mg/dL (ref 0.50–1.10)

## 2012-09-14 MED ORDER — SODIUM CHLORIDE 0.9 % IV SOLN: 1000.0000 mL | INTRAVENOUS | Status: DC

## 2012-09-14 NOTE — Progress Notes (Signed)
[facility] Progress Note   Rhonda Singh:[id number] DOB: 1930/08/15 DOA: 09/11/2012 PCP: No primary provider on file.   HPI/Subjective: Looks great, denies any fever or chills.  Brief narrative: 79 year old female patient essentially healthy. Presented to the ER with acute onset of chills and rigors for less than 24 hours. Apparent dysuria for a few days preceding this. She had been treating this with over-the-counter medications. She had no other symptoms. In the emergency department her urinalysis was abnormal with too numerous to count white cells. Her serum white count was normal. She was hypotensive at presentation and required multiple boluses of IV fluids and at time of admission her systolic blood pressure had increased to 88. She was mentally intact and otherwise had no signs of hypoperfusion although her MAP was less than 65.  Assessment/Plan:  Sepsis / Hypotension - urinary source Clinically resolved with normalization of blood pressure  Escherichia coli UTI  Pansensitive UTI, can be discharged on high-dose oral Cipro in the morning  DM2  CBG is reasonably controlled at the present time - follow trend without change today - is on diet control alone at home  Hypokalemia Resolved with repletion  Dehydration Resolved with volume resuscitation  Acute kidney injury Creatinine has improved markedly with volume resuscitation  Abnormal cardiac rythmn Telemetry monitoring revealed a few PVC couplets and a sinus pause with a dropped QRS/nonconducted P wave - follow on telemetry  History of hypertension Will likely need to resume home blood pressure meds within the next 24 hours  Obesity  Hypothyroidism continue Synthroid  Hyperlipidemia Continue home Crestor  DVT prophylaxis: Lovenox Code Status: Full Family Communication: Patient only Disposition Plan: Home in a.m.  Consultants: None  Procedures: None  Antibiotics: Rocephin 7/10  >>>  Objective: Blood pressure 133/59, pulse 79, temperature 98.7 F (37.1 C), temperature source Oral, resp. rate 18, height 5\' 4"  (1.626 m), weight 87.9 kg (193 lb 12.6 oz), SpO2 95.00%. No intake or output data in the 24 hours ending 09/14/12 1208 Exam: General: No acute respiratory distress Lungs: Clear to auscultation bilaterally without wheezes or crackles Cardiovascular: Regular rate and rhythm at present time without appreciable murmur gallop or rub Abdomen: Nontender, nondistended, soft, bowel sounds positive, no rebound, no ascites, no appreciable mass Musculoskeletal: No significant cyanosis, clubbing of bilateral lower extremities Neurological: Alert and oriented x 3, CN 2-12 intact  Scheduled Meds: Scheduled Meds: . aspirin EC  81 mg Oral QHS  . atorvastatin  40 mg Oral q1800  . cefTRIAXone (ROCEPHIN)  IV  1 g Intravenous Q24H  . docusate sodium  100 mg Oral BID  . enoxaparin (LOVENOX) injection  40 mg Subcutaneous Q24H  . ezetimibe  10 mg Oral QHS  . insulin aspart  0-15 Units Subcutaneous TID WC  . insulin aspart  0-5 Units Subcutaneous QHS  . levothyroxine  100 mcg Oral QAC breakfast  . sodium chloride  3 mL Intravenous Q12H    Data Reviewed: Basic Metabolic Panel:  Recent Labs Lab 09/11/12 1539 09/13/12 0610 09/14/12 0505  NA 142 143 141  K 3.3* 4.2 4.1  CL 106 112 113*  CO2 24 24 24   GLUCOSE 163* 95 123*  BUN 23 18 17   CREATININE 1.12* 0.91 0.89  CALCIUM 8.8 8.9 9.4   Liver Function Tests:  Recent Labs Lab 09/11/12 1539  AST 27  ALT 25  ALKPHOS 54  BILITOT 0.6  PROT 5.5*  ALBUMIN 2.8*   CBC:  Recent Labs Lab 09/11/12 1539 09/13/12 0610  WBC 5.7 7.8  NEUTROABS 5.0  --   HGB 11.6* 11.2*  HCT 34.2* 33.5*  MCV 96.1 97.4  PLT 148* 188   CBG:  Recent Labs Lab 09/13/12 0758 09/13/12 1208 09/13/12 1701 09/13/12 2106 09/14/12 0752  GLUCAP 88 131* 102* 148* 108*    Recent Results (from the past 240 hour(s))  CULTURE, BLOOD  (ROUTINE X 2)     Status: None   Collection Time    09/11/12  3:51 PM      Result Value Range Status   Specimen Description BLOOD WRIST RIGHT   Final   Special Requests BOTTLES DRAWN AEROBIC ONLY 8CC   Final   Culture  Setup Time 09/11/2012 23:13   Final   Culture     Final   Value:        BLOOD CULTURE RECEIVED NO GROWTH TO DATE CULTURE WILL BE HELD FOR 5 DAYS BEFORE ISSUING A FINAL NEGATIVE REPORT   Report Status PENDING   Incomplete  CULTURE, BLOOD (ROUTINE X 2)     Status: None   Collection Time    09/11/12  4:05 PM      Result Value Range Status   Specimen Description BLOOD HAND LEFT   Final   Special Requests BOTTLES DRAWN AEROBIC ONLY [facility]   Final   Culture  Setup Time 09/11/2012 23:13   Final   Culture     Final   Value:        BLOOD CULTURE RECEIVED NO GROWTH TO DATE CULTURE WILL BE HELD FOR 5 DAYS BEFORE ISSUING A FINAL NEGATIVE REPORT   Report Status PENDING   Incomplete  URINE CULTURE     Status: None   Collection Time    09/11/12  4:46 PM      Result Value Range Status   Specimen Description URINE, CATHETERIZED   Final   Special Requests NONE   Final   Culture  Setup Time 09/12/2012 00:42   Final   Colony Count >=100,000 COLONIES/ML   Final   Culture ESCHERICHIA COLI   Final   Report Status 09/13/2012 FINAL   Final   Organism ID, Bacteria ESCHERICHIA COLI   Final  MRSA PCR SCREENING     Status: None   Collection Time    09/11/12  9:38 PM      Result Value Range Status   MRSA by PCR NEGATIVE  NEGATIVE Final   Comment:            The GeneXpert MRSA Assay (FDA     approved for NASAL specimens     only), is one component of a     comprehensive MRSA colonization     surveillance program. It is not     intended to diagnose MRSA     infection nor to guide or     monitor treatment for     MRSA infections.     Studies:  Recent x-ray studies have been reviewed in detail by the Attending Physician  Clint Lipps, MD [facility] Office   [phone] Pager [phone]  On-Call/Text Page:      [email or web address]      password [email or web address]  If 7PM-7AM, please contact night-coverage [email or web address] Password [email or web address] 09/14/2012, 12:08 PM   LOS: 3 days

## 2012-09-15 LAB — GLUCOSE, CAPILLARY

## 2012-09-15 LAB — BASIC METABOLIC PANEL
CO2: 26 mEq/L (ref 19–32)
Chloride: 112 mEq/L (ref 96–112)
Sodium: 144 mEq/L (ref 135–145)

## 2012-09-15 MED ORDER — CIPROFLOXACIN HCL 500 MG PO TABS: 500.0000 mg | ORAL_TABLET | Freq: Two times a day (BID) | ORAL | Status: DC

## 2012-09-15 NOTE — Discharge Summary (Signed)
Physician Discharge Summary  RIVA SESMA ZOX:[id number] DOB: 12-Oct-1930 DOA: 09/11/2012  PCP: Laurell Josephs, MD  Admit date: 09/11/2012 Discharge date: 09/15/2012  Time spent: 40 minutes  Recommendations for Outpatient Follow-up:  1. Followup with PCP in 1 week  Discharge Diagnoses:  Active Problems:   Sepsis   Hypotension arterial   History of hypertension   DM2 (diabetes mellitus, type 2)   Obesity   Hypothyroidism   Hyperlipidemia   UTI (lower urinary tract infection)   Hypokalemia   Dehydration   Abnormal cardiac rate   Discharge Condition: Stable  Diet recommendation: Heart healthy  Filed Weights   09/11/12 2133  Weight: 87.9 kg (193 lb 12.6 oz)    History of present illness:  79 year old female patient essentially healthy. Presented to the ER with acute onset of chills and rigors for less than 24 hours. Apparent dysuria for a few days preceding this. She had been treating this with over-the-counter medications. She had no other symptoms. In the emergency department her urinalysis was abnormal with too numerous to count white cells. Her serum white count was normal. She was hypotensive at presentation and required multiple boluses of IV fluids and at time of admission her systolic blood pressure had increased to 88. She was mentally intact and otherwise had no signs of hypoperfusion although her MAP was less than 65.  Hospital Course:   1. Sepsis: Patient given to the hospital with low blood pressure and fever. Because patient is UTI this qualifies her for sepsis, patient started on Rocephin. Her blood pressure medications were held, aggressive resuscitation with boluses of normal saline were given. Patient improved and blood pressures normalized, on the day of discharge blood pressure dictations were restarted.  2. UTI: Causing the sepsis, upon admission urinalysis was consistent with UTI, as mentioned above Rocephin was started empirically. Culture came back  positive for Escherichia coli which is pansensitive. Patient switched to ciprofloxacin to complete 14 days of antibiotics.  3. Acute kidney injury: Patient came in with mild acute renal failure with creatinine of 1.12, which is likely secondary to volume depletion and low blood pressure. Aggressive volume resuscitation resulted in normalization of the creatinine.  4. Diabetes mellitus type 2: Patient is not on any hypoglycemic agents, she is controlling her diabetes with diet only.  5. Hypertension: Blood pressure medications were held at the time of admission because of low blood pressure from the sepsis syndrome. Patient is on lisinopril/HCTZ and clonidine at nighttime. Systolic blood pressure in 140s to 150s so the lisinopril and the HCTZ were restarted and the clonidine is held at the time of discharge. Patient today is 5 days without clonidine, probably she will not have rebound hypertension/tachycardia from stopping it.   Procedures:  None  Consultations:  None  Discharge Exam: Filed Vitals:   09/14/12 0536 09/14/12 1527 09/14/12 2141 09/15/12 0513  BP: 133/59 148/76 161/87 150/79  Pulse: 79 76 84 77  Temp: 98.7 F (37.1 C) 98.8 F (37.1 C) 98.3 F (36.8 C) 98.3 F (36.8 C)  TempSrc: Oral Oral Oral Oral  Resp: 18 18 18 20   Height:      Weight:      SpO2: 95% 97% 96% 94%   General: Alert and awake, oriented x3, not in any acute distress. HEENT: anicteric sclera, pupils reactive to light and accommodation, EOMI CVS: S1-S2 clear, no murmur rubs or gallops Chest: clear to auscultation bilaterally, no wheezing, rales or rhonchi Abdomen: soft nontender, nondistended, normal bowel sounds, no  organomegaly Extremities: no cyanosis, clubbing or edema noted bilaterally Neuro: Cranial nerves II-XII intact, no focal neurological deficits  Discharge Instructions  Discharge Orders   Future Orders Complete By Expires     Diet - low sodium heart healthy  As directed     Increase  activity slowly  As directed         Medication List    STOP taking these medications       cloNIDine 0.2 MG tablet  Commonly known as:  CATAPRES      TAKE these medications       aspirin EC 81 MG tablet  Take 81 mg by mouth at bedtime.     ciprofloxacin 500 MG tablet  Commonly known as:  CIPRO  Take 1 tablet (500 mg total) by mouth 2 (two) times daily.     CLEAR EYES OP  Apply 1 drop to eye daily as needed. For dry eyes     ezetimibe 10 MG tablet  Commonly known as:  ZETIA  Take 10 mg by mouth at bedtime.     levothyroxine 100 MCG tablet  Commonly known as:  SYNTHROID, LEVOTHROID  Take 100 mcg by mouth daily before breakfast.     lisinopril-hydrochlorothiazide 20-25 MG per tablet  Commonly known as:  PRINZIDE,ZESTORETIC  Take 1 tablet by mouth every morning.     multivitamin with minerals Tabs  Take 1 tablet by mouth daily.     naproxen sodium 220 MG tablet  Commonly known as:  ANAPROX  Take 220 mg by mouth daily as needed. For pain     rosuvastatin 20 MG tablet  Commonly known as:  CRESTOR  Take 20 mg by mouth at bedtime.       No Known Allergies     Follow-up Information   Follow up with Laurell Josephs, MD In 1 week.   Contact information:   [address] [place] [address] [phone]        The results of significant diagnostics from this hospitalization (including imaging, microbiology, ancillary and laboratory) are listed below for reference.    Significant Diagnostic Studies: Dg Chest 2 View  09/11/2012   *RADIOLOGY REPORT*  Clinical Data: History of fever and weakness.  CHEST - 2 VIEW  Comparison: 02/03/2008 study.  Findings: Cardiac silhouette is upper range of normal size. Ectasia and nonaneurysmal calcification of the thoracic aorta are seen.  No hilar enlargement is seen.  No pulmonary infiltrates or masses were evident. No pleural abnormality is evident. There is osteopenic appearance of the bones. There is minimal  degenerative spondylosis.  IMPRESSION: No acute cardiopulmonary or pleural abnormalities are evident.   Original Report Authenticated By: Onalee Hua Call    Microbiology: Recent Results (from the past 240 hour(s))  CULTURE, BLOOD (ROUTINE X 2)     Status: None   Collection Time    09/11/12  3:51 PM      Result Value Range Status   Specimen Description BLOOD WRIST RIGHT   Final   Special Requests BOTTLES DRAWN AEROBIC ONLY 8CC   Final   Culture  Setup Time 09/11/2012 23:13   Final   Culture     Final   Value:        BLOOD CULTURE RECEIVED NO GROWTH TO DATE CULTURE WILL BE HELD FOR 5 DAYS BEFORE ISSUING A FINAL NEGATIVE REPORT   Report Status PENDING   Incomplete  CULTURE, BLOOD (ROUTINE X 2)     Status: None   Collection  Time    09/11/12  4:05 PM      Result Value Range Status   Specimen Description BLOOD HAND LEFT   Final   Special Requests BOTTLES DRAWN AEROBIC ONLY [facility]   Final   Culture  Setup Time 09/11/2012 23:13   Final   Culture     Final   Value:        BLOOD CULTURE RECEIVED NO GROWTH TO DATE CULTURE WILL BE HELD FOR 5 DAYS BEFORE ISSUING A FINAL NEGATIVE REPORT   Report Status PENDING   Incomplete  URINE CULTURE     Status: None   Collection Time    09/11/12  4:46 PM      Result Value Range Status   Specimen Description URINE, CATHETERIZED   Final   Special Requests NONE   Final   Culture  Setup Time 09/12/2012 00:42   Final   Colony Count >=100,000 COLONIES/ML   Final   Culture ESCHERICHIA COLI   Final   Report Status 09/13/2012 FINAL   Final   Organism ID, Bacteria ESCHERICHIA COLI   Final  MRSA PCR SCREENING     Status: None   Collection Time    09/11/12  9:38 PM      Result Value Range Status   MRSA by PCR NEGATIVE  NEGATIVE Final   Comment:            The GeneXpert MRSA Assay (FDA     approved for NASAL specimens     only), is one component of a     comprehensive MRSA colonization     surveillance program. It is not     intended to diagnose MRSA     infection  nor to guide or     monitor treatment for     MRSA infections.     Labs: Basic Metabolic Panel:  Recent Labs Lab 09/11/12 1539 09/13/12 0610 09/14/12 0505 09/15/12 0500  NA 142 143 141 144  K 3.3* 4.2 4.1 4.1  CL 106 112 113* 112  CO2 24 24 24 26   GLUCOSE 163* 95 123* 109*  BUN 23 18 17 14   CREATININE 1.12* 0.91 0.89 0.82  CALCIUM 8.8 8.9 9.4 9.6   Liver Function Tests:  Recent Labs Lab 09/11/12 1539  AST 27  ALT 25  ALKPHOS 54  BILITOT 0.6  PROT 5.5*  ALBUMIN 2.8*   No results found for this basename: LIPASE, AMYLASE,  in the last 168 hours No results found for this basename: AMMONIA,  in the last 168 hours CBC:  Recent Labs Lab 09/11/12 1539 09/13/12 0610  WBC 5.7 7.8  NEUTROABS 5.0  --   HGB 11.6* 11.2*  HCT 34.2* 33.5*  MCV 96.1 97.4  PLT 148* 188   Cardiac Enzymes: No results found for this basename: CKTOTAL, CKMB, CKMBINDEX, TROPONINI,  in the last 168 hours BNP: BNP (last 3 results) No results found for this basename: PROBNP,  in the last 8760 hours CBG:  Recent Labs Lab 09/14/12 1233 09/14/12 1734 09/14/12 2143 09/15/12 0810 09/15/12 1218  GLUCAP 105* 105* 131* 104* 95       Signed:  Theodoro Koval A  [facility] 09/15/2012, 12:27 PM

## 2012-09-15 NOTE — Progress Notes (Signed)
NURSING PROGRESS NOTE  Rhonda Singh [id number] Discharge Data: 09/15/2012 1:37 PM Attending Provider: No att. providers found WUJ:[id number], Maeola Sarah, MD     Skipper Cliche to be D/C'd Home per MD order.  Discussed with the patient the After Visit Summary and all questions fully answered. All IV's discontinued with no bleeding noted. All belongings returned to patient for patient to take home.   Last Vital Signs:  Blood pressure 150/79, pulse 77, temperature 98.3 F (36.8 C), temperature source Oral, resp. rate 20, height 5\' 4"  (1.626 m), weight 87.9 kg (193 lb 12.6 oz), SpO2 94.00%.  Discharge Medication List   Medication List    STOP taking these medications       cloNIDine 0.2 MG tablet  Commonly known as:  CATAPRES      TAKE these medications       aspirin EC 81 MG tablet  Take 81 mg by mouth at bedtime.     ciprofloxacin 500 MG tablet  Commonly known as:  CIPRO  Take 1 tablet (500 mg total) by mouth 2 (two) times daily.     CLEAR EYES OP  Apply 1 drop to eye daily as needed. For dry eyes     ezetimibe 10 MG tablet  Commonly known as:  ZETIA  Take 10 mg by mouth at bedtime.     levothyroxine 100 MCG tablet  Commonly known as:  SYNTHROID, LEVOTHROID  Take 100 mcg by mouth daily before breakfast.     lisinopril-hydrochlorothiazide 20-25 MG per tablet  Commonly known as:  PRINZIDE,ZESTORETIC  Take 1 tablet by mouth every morning.     multivitamin with minerals Tabs  Take 1 tablet by mouth daily.     naproxen sodium 220 MG tablet  Commonly known as:  ANAPROX  Take 220 mg by mouth daily as needed. For pain     rosuvastatin 20 MG tablet  Commonly known as:  CRESTOR  Take 20 mg by mouth at bedtime.

## 2012-09-17 LAB — CULTURE, BLOOD (ROUTINE X 2): Culture: NO GROWTH

## 2013-04-17 ENCOUNTER — Other Ambulatory Visit: Payer: Self-pay | Admitting: Gastroenterology

## 2013-07-23 ENCOUNTER — Other Ambulatory Visit: Payer: Self-pay

## 2013-07-23 DIAGNOSIS — Z1231 Encounter for screening mammogram for malignant neoplasm of breast: Secondary | ICD-10-CM

## 2013-07-30 ENCOUNTER — Encounter (INDEPENDENT_AMBULATORY_CARE_PROVIDER_SITE_OTHER): Payer: Self-pay

## 2013-07-30 ENCOUNTER — Ambulatory Visit
Admission: RE | Admit: 2013-07-30 | Discharge: 2013-07-30 | Disposition: A | Discharge status: Home / Self Care | Payer: Medicare Other | Source: Ambulatory Visit

## 2013-07-30 DIAGNOSIS — Z1231 Encounter for screening mammogram for malignant neoplasm of breast: Secondary | ICD-10-CM

## 2014-01-26 ENCOUNTER — Other Ambulatory Visit: Payer: Self-pay | Admitting: Family Medicine

## 2014-01-26 DIAGNOSIS — N63 Unspecified lump in unspecified breast: Secondary | ICD-10-CM

## 2014-02-04 ENCOUNTER — Ambulatory Visit
Admission: RE | Admit: 2014-02-04 | Discharge: 2014-02-04 | Disposition: A | Discharge status: Home / Self Care | Payer: Medicare Other | Source: Ambulatory Visit | Attending: Family Medicine | Admitting: Family Medicine

## 2014-02-04 DIAGNOSIS — N63 Unspecified lump in unspecified breast: Secondary | ICD-10-CM

## 2014-07-26 ENCOUNTER — Other Ambulatory Visit: Payer: Self-pay

## 2014-07-26 DIAGNOSIS — Z1231 Encounter for screening mammogram for malignant neoplasm of breast: Secondary | ICD-10-CM

## 2014-08-27 ENCOUNTER — Ambulatory Visit
Admission: RE | Admit: 2014-08-27 | Discharge: 2014-08-27 | Disposition: A | Discharge status: Home / Self Care | Payer: Medicare Other | Source: Ambulatory Visit

## 2014-08-27 DIAGNOSIS — Z1231 Encounter for screening mammogram for malignant neoplasm of breast: Secondary | ICD-10-CM

## 2015-09-08 ENCOUNTER — Other Ambulatory Visit: Payer: Self-pay | Admitting: Family Medicine

## 2015-09-08 DIAGNOSIS — Z1231 Encounter for screening mammogram for malignant neoplasm of breast: Secondary | ICD-10-CM

## 2015-09-16 ENCOUNTER — Ambulatory Visit
Admission: RE | Admit: 2015-09-16 | Discharge: 2015-09-16 | Disposition: A | Discharge status: Home / Self Care | Payer: Medicare Other | Source: Ambulatory Visit | Attending: Family Medicine | Admitting: Family Medicine

## 2015-09-16 DIAGNOSIS — Z1231 Encounter for screening mammogram for malignant neoplasm of breast: Secondary | ICD-10-CM

## 2016-12-11 ENCOUNTER — Other Ambulatory Visit: Payer: Self-pay | Admitting: Family Medicine

## 2016-12-11 DIAGNOSIS — Z1231 Encounter for screening mammogram for malignant neoplasm of breast: Secondary | ICD-10-CM

## 2016-12-26 ENCOUNTER — Ambulatory Visit
Admission: RE | Admit: 2016-12-26 | Discharge: 2016-12-26 | Disposition: A | Discharge status: Home / Self Care | Payer: Medicare Other | Source: Ambulatory Visit | Attending: Family Medicine | Admitting: Family Medicine

## 2016-12-26 DIAGNOSIS — Z1231 Encounter for screening mammogram for malignant neoplasm of breast: Secondary | ICD-10-CM

## 2017-11-21 ENCOUNTER — Other Ambulatory Visit: Payer: Self-pay | Admitting: Family Medicine

## 2017-11-21 DIAGNOSIS — Z1231 Encounter for screening mammogram for malignant neoplasm of breast: Secondary | ICD-10-CM

## 2017-12-27 ENCOUNTER — Ambulatory Visit: Payer: Medicare Other

## 2018-02-04 ENCOUNTER — Ambulatory Visit
Admission: RE | Admit: 2018-02-04 | Discharge: 2018-02-04 | Disposition: A | Discharge status: Home / Self Care | Payer: Medicare Other | Source: Ambulatory Visit | Attending: Family Medicine | Admitting: Family Medicine

## 2018-02-04 DIAGNOSIS — Z1231 Encounter for screening mammogram for malignant neoplasm of breast: Secondary | ICD-10-CM

## 2019-02-09 ENCOUNTER — Other Ambulatory Visit: Payer: Self-pay | Admitting: Family Medicine

## 2019-02-09 DIAGNOSIS — Z1231 Encounter for screening mammogram for malignant neoplasm of breast: Secondary | ICD-10-CM

## 2019-03-31 ENCOUNTER — Ambulatory Visit
Admission: RE | Admit: 2019-03-31 | Discharge: 2019-03-31 | Disposition: A | Discharge status: Home / Self Care | Payer: Medicare Other | Source: Ambulatory Visit | Attending: Family Medicine | Admitting: Family Medicine

## 2019-03-31 ENCOUNTER — Other Ambulatory Visit: Payer: Self-pay | Admitting: Family Medicine

## 2019-03-31 ENCOUNTER — Other Ambulatory Visit: Payer: Self-pay

## 2019-03-31 DIAGNOSIS — Z1231 Encounter for screening mammogram for malignant neoplasm of breast: Secondary | ICD-10-CM

## 2019-03-31 DIAGNOSIS — R928 Other abnormal and inconclusive findings on diagnostic imaging of breast: Secondary | ICD-10-CM

## 2019-04-03 ENCOUNTER — Other Ambulatory Visit: Payer: Self-pay

## 2019-04-03 ENCOUNTER — Other Ambulatory Visit: Payer: Self-pay | Admitting: Family Medicine

## 2019-04-03 ENCOUNTER — Ambulatory Visit
Admission: RE | Admit: 2019-04-03 | Discharge: 2019-04-03 | Disposition: A | Discharge status: Home / Self Care | Payer: Medicare Other | Source: Ambulatory Visit | Attending: Family Medicine | Admitting: Family Medicine

## 2019-04-03 DIAGNOSIS — R928 Other abnormal and inconclusive findings on diagnostic imaging of breast: Secondary | ICD-10-CM

## 2019-10-06 ENCOUNTER — Other Ambulatory Visit: Payer: Self-pay

## 2019-10-06 ENCOUNTER — Ambulatory Visit
Admission: RE | Admit: 2019-10-06 | Discharge: 2019-10-06 | Disposition: A | Discharge status: Home / Self Care | Payer: Medicare Other | Source: Ambulatory Visit | Attending: Family Medicine | Admitting: Family Medicine

## 2019-10-06 DIAGNOSIS — R928 Other abnormal and inconclusive findings on diagnostic imaging of breast: Secondary | ICD-10-CM

## 2020-04-29 DIAGNOSIS — E785 Hyperlipidemia, unspecified: Secondary | ICD-10-CM | POA: Diagnosis not present

## 2020-04-29 DIAGNOSIS — I10 Essential (primary) hypertension: Secondary | ICD-10-CM | POA: Diagnosis not present

## 2020-04-29 DIAGNOSIS — E039 Hypothyroidism, unspecified: Secondary | ICD-10-CM | POA: Diagnosis not present

## 2020-04-29 DIAGNOSIS — E1169 Type 2 diabetes mellitus with other specified complication: Secondary | ICD-10-CM | POA: Diagnosis not present

## 2020-05-04 DIAGNOSIS — H2513 Age-related nuclear cataract, bilateral: Secondary | ICD-10-CM | POA: Diagnosis not present

## 2020-05-04 DIAGNOSIS — H25013 Cortical age-related cataract, bilateral: Secondary | ICD-10-CM | POA: Diagnosis not present

## 2020-05-04 DIAGNOSIS — H35033 Hypertensive retinopathy, bilateral: Secondary | ICD-10-CM | POA: Diagnosis not present

## 2020-05-04 DIAGNOSIS — E119 Type 2 diabetes mellitus without complications: Secondary | ICD-10-CM | POA: Diagnosis not present

## 2020-06-22 DIAGNOSIS — E349 Endocrine disorder, unspecified: Secondary | ICD-10-CM | POA: Diagnosis not present

## 2020-06-22 DIAGNOSIS — M810 Age-related osteoporosis without current pathological fracture: Secondary | ICD-10-CM | POA: Diagnosis not present

## 2020-06-22 DIAGNOSIS — E039 Hypothyroidism, unspecified: Secondary | ICD-10-CM | POA: Diagnosis not present

## 2020-06-24 DIAGNOSIS — M810 Age-related osteoporosis without current pathological fracture: Secondary | ICD-10-CM | POA: Insufficient documentation

## 2020-06-30 ENCOUNTER — Other Ambulatory Visit: Payer: Self-pay | Admitting: Internal Medicine

## 2020-06-30 DIAGNOSIS — M810 Age-related osteoporosis without current pathological fracture: Secondary | ICD-10-CM

## 2020-07-14 ENCOUNTER — Other Ambulatory Visit: Payer: Self-pay

## 2020-07-14 ENCOUNTER — Ambulatory Visit
Admission: RE | Admit: 2020-07-14 | Discharge: 2020-07-14 | Disposition: A | Discharge status: Home / Self Care | Payer: Medicare Other | Source: Ambulatory Visit | Attending: Internal Medicine | Admitting: Internal Medicine

## 2020-07-14 DIAGNOSIS — Z780 Asymptomatic menopausal state: Secondary | ICD-10-CM | POA: Diagnosis not present

## 2020-07-14 DIAGNOSIS — M810 Age-related osteoporosis without current pathological fracture: Secondary | ICD-10-CM

## 2020-07-14 DIAGNOSIS — M85852 Other specified disorders of bone density and structure, left thigh: Secondary | ICD-10-CM | POA: Diagnosis not present

## 2020-07-25 ENCOUNTER — Other Ambulatory Visit: Payer: Self-pay | Admitting: Family Medicine

## 2020-07-25 DIAGNOSIS — Z1231 Encounter for screening mammogram for malignant neoplasm of breast: Secondary | ICD-10-CM

## 2020-08-22 DIAGNOSIS — E349 Endocrine disorder, unspecified: Secondary | ICD-10-CM | POA: Diagnosis not present

## 2020-09-16 DIAGNOSIS — E559 Vitamin D deficiency, unspecified: Secondary | ICD-10-CM | POA: Diagnosis not present

## 2020-09-16 DIAGNOSIS — I10 Essential (primary) hypertension: Secondary | ICD-10-CM | POA: Diagnosis not present

## 2020-09-16 DIAGNOSIS — E039 Hypothyroidism, unspecified: Secondary | ICD-10-CM | POA: Diagnosis not present

## 2020-09-16 DIAGNOSIS — E1169 Type 2 diabetes mellitus with other specified complication: Secondary | ICD-10-CM | POA: Diagnosis not present

## 2020-09-16 DIAGNOSIS — M5432 Sciatica, left side: Secondary | ICD-10-CM | POA: Diagnosis not present

## 2020-09-20 ENCOUNTER — Ambulatory Visit: Payer: Medicare Other

## 2020-09-30 ENCOUNTER — Ambulatory Visit: Payer: Medicare Other

## 2020-09-30 ENCOUNTER — Ambulatory Visit
Admission: RE | Admit: 2020-09-30 | Discharge: 2020-09-30 | Disposition: A | Discharge status: Home / Self Care | Payer: Medicare Other | Source: Ambulatory Visit | Attending: Family Medicine | Admitting: Family Medicine

## 2020-09-30 ENCOUNTER — Other Ambulatory Visit: Payer: Self-pay

## 2020-09-30 DIAGNOSIS — Z1231 Encounter for screening mammogram for malignant neoplasm of breast: Secondary | ICD-10-CM

## 2020-10-07 DIAGNOSIS — M549 Dorsalgia, unspecified: Secondary | ICD-10-CM | POA: Diagnosis not present

## 2020-10-07 DIAGNOSIS — M5432 Sciatica, left side: Secondary | ICD-10-CM | POA: Diagnosis not present

## 2020-10-17 DIAGNOSIS — E213 Hyperparathyroidism, unspecified: Secondary | ICD-10-CM | POA: Diagnosis not present

## 2020-10-17 DIAGNOSIS — E039 Hypothyroidism, unspecified: Secondary | ICD-10-CM | POA: Diagnosis not present

## 2020-10-17 DIAGNOSIS — M8580 Other specified disorders of bone density and structure, unspecified site: Secondary | ICD-10-CM | POA: Diagnosis not present

## 2020-11-16 DIAGNOSIS — E349 Endocrine disorder, unspecified: Secondary | ICD-10-CM | POA: Diagnosis not present

## 2020-11-23 DIAGNOSIS — H25043 Posterior subcapsular polar age-related cataract, bilateral: Secondary | ICD-10-CM | POA: Diagnosis not present

## 2020-11-23 DIAGNOSIS — H25013 Cortical age-related cataract, bilateral: Secondary | ICD-10-CM | POA: Diagnosis not present

## 2020-11-23 DIAGNOSIS — H524 Presbyopia: Secondary | ICD-10-CM | POA: Diagnosis not present

## 2020-11-23 DIAGNOSIS — E119 Type 2 diabetes mellitus without complications: Secondary | ICD-10-CM | POA: Diagnosis not present

## 2020-11-23 DIAGNOSIS — H2513 Age-related nuclear cataract, bilateral: Secondary | ICD-10-CM | POA: Diagnosis not present

## 2020-11-23 DIAGNOSIS — H35372 Puckering of macula, left eye: Secondary | ICD-10-CM | POA: Diagnosis not present

## 2020-12-20 DIAGNOSIS — H2512 Age-related nuclear cataract, left eye: Secondary | ICD-10-CM | POA: Diagnosis not present

## 2020-12-20 DIAGNOSIS — H25812 Combined forms of age-related cataract, left eye: Secondary | ICD-10-CM | POA: Diagnosis not present

## 2021-01-04 DIAGNOSIS — H2511 Age-related nuclear cataract, right eye: Secondary | ICD-10-CM | POA: Diagnosis not present

## 2021-01-04 DIAGNOSIS — H2589 Other age-related cataract: Secondary | ICD-10-CM | POA: Diagnosis not present

## 2021-01-04 DIAGNOSIS — H25011 Cortical age-related cataract, right eye: Secondary | ICD-10-CM | POA: Diagnosis not present

## 2021-01-04 DIAGNOSIS — H25041 Posterior subcapsular polar age-related cataract, right eye: Secondary | ICD-10-CM | POA: Diagnosis not present

## 2021-01-10 DIAGNOSIS — H25011 Cortical age-related cataract, right eye: Secondary | ICD-10-CM | POA: Diagnosis not present

## 2021-01-10 DIAGNOSIS — H25041 Posterior subcapsular polar age-related cataract, right eye: Secondary | ICD-10-CM | POA: Diagnosis not present

## 2021-01-10 DIAGNOSIS — H25811 Combined forms of age-related cataract, right eye: Secondary | ICD-10-CM | POA: Diagnosis not present

## 2021-01-10 DIAGNOSIS — H2511 Age-related nuclear cataract, right eye: Secondary | ICD-10-CM | POA: Diagnosis not present

## 2021-01-10 DIAGNOSIS — H2589 Other age-related cataract: Secondary | ICD-10-CM | POA: Diagnosis not present

## 2021-01-20 ENCOUNTER — Other Ambulatory Visit: Payer: Self-pay | Admitting: Internal Medicine

## 2021-01-20 DIAGNOSIS — M8580 Other specified disorders of bone density and structure, unspecified site: Secondary | ICD-10-CM | POA: Diagnosis not present

## 2021-01-20 DIAGNOSIS — E039 Hypothyroidism, unspecified: Secondary | ICD-10-CM | POA: Diagnosis not present

## 2021-01-20 DIAGNOSIS — E213 Hyperparathyroidism, unspecified: Secondary | ICD-10-CM | POA: Diagnosis not present

## 2021-02-03 DIAGNOSIS — Z23 Encounter for immunization: Secondary | ICD-10-CM | POA: Diagnosis not present

## 2021-02-03 DIAGNOSIS — I10 Essential (primary) hypertension: Secondary | ICD-10-CM | POA: Diagnosis not present

## 2021-02-03 DIAGNOSIS — M8588 Other specified disorders of bone density and structure, other site: Secondary | ICD-10-CM | POA: Diagnosis not present

## 2021-02-03 DIAGNOSIS — E1169 Type 2 diabetes mellitus with other specified complication: Secondary | ICD-10-CM | POA: Diagnosis not present

## 2021-02-03 DIAGNOSIS — E785 Hyperlipidemia, unspecified: Secondary | ICD-10-CM | POA: Diagnosis not present

## 2021-02-03 DIAGNOSIS — E039 Hypothyroidism, unspecified: Secondary | ICD-10-CM | POA: Diagnosis not present

## 2021-02-03 DIAGNOSIS — Z0000 Encounter for general adult medical examination without abnormal findings: Secondary | ICD-10-CM | POA: Diagnosis not present

## 2021-02-15 ENCOUNTER — Other Ambulatory Visit: Payer: Self-pay | Admitting: Internal Medicine

## 2021-02-15 DIAGNOSIS — Z79899 Other long term (current) drug therapy: Secondary | ICD-10-CM

## 2021-02-15 DIAGNOSIS — M810 Age-related osteoporosis without current pathological fracture: Secondary | ICD-10-CM

## 2021-03-05 HISTORY — PX: BREAST LUMPECTOMY: SHX2

## 2021-06-06 DIAGNOSIS — E1169 Type 2 diabetes mellitus with other specified complication: Secondary | ICD-10-CM | POA: Diagnosis not present

## 2021-06-06 DIAGNOSIS — I10 Essential (primary) hypertension: Secondary | ICD-10-CM | POA: Diagnosis not present

## 2021-06-06 DIAGNOSIS — E559 Vitamin D deficiency, unspecified: Secondary | ICD-10-CM | POA: Diagnosis not present

## 2021-06-06 DIAGNOSIS — E039 Hypothyroidism, unspecified: Secondary | ICD-10-CM | POA: Diagnosis not present

## 2021-06-06 DIAGNOSIS — M5432 Sciatica, left side: Secondary | ICD-10-CM | POA: Diagnosis not present

## 2021-07-17 ENCOUNTER — Ambulatory Visit
Admission: RE | Admit: 2021-07-17 | Discharge: 2021-07-17 | Disposition: A | Discharge status: Home / Self Care | Payer: Medicare Other | Source: Ambulatory Visit | Attending: Internal Medicine | Admitting: Internal Medicine

## 2021-07-17 DIAGNOSIS — Z79899 Other long term (current) drug therapy: Secondary | ICD-10-CM

## 2021-07-17 DIAGNOSIS — M810 Age-related osteoporosis without current pathological fracture: Secondary | ICD-10-CM

## 2021-07-17 DIAGNOSIS — M8589 Other specified disorders of bone density and structure, multiple sites: Secondary | ICD-10-CM | POA: Diagnosis not present

## 2021-07-17 DIAGNOSIS — Z780 Asymptomatic menopausal state: Secondary | ICD-10-CM | POA: Diagnosis not present

## 2021-07-17 DIAGNOSIS — E039 Hypothyroidism, unspecified: Secondary | ICD-10-CM | POA: Diagnosis not present

## 2021-07-17 DIAGNOSIS — E213 Hyperparathyroidism, unspecified: Secondary | ICD-10-CM | POA: Diagnosis not present

## 2021-07-21 DIAGNOSIS — E213 Hyperparathyroidism, unspecified: Secondary | ICD-10-CM | POA: Diagnosis not present

## 2021-07-21 DIAGNOSIS — R000 Tachycardia, unspecified: Secondary | ICD-10-CM | POA: Diagnosis not present

## 2021-07-21 DIAGNOSIS — E039 Hypothyroidism, unspecified: Secondary | ICD-10-CM | POA: Diagnosis not present

## 2021-07-21 DIAGNOSIS — M85859 Other specified disorders of bone density and structure, unspecified thigh: Secondary | ICD-10-CM | POA: Diagnosis not present

## 2021-09-07 DIAGNOSIS — H35372 Puckering of macula, left eye: Secondary | ICD-10-CM | POA: Diagnosis not present

## 2021-09-07 DIAGNOSIS — H524 Presbyopia: Secondary | ICD-10-CM | POA: Diagnosis not present

## 2021-09-07 DIAGNOSIS — H35033 Hypertensive retinopathy, bilateral: Secondary | ICD-10-CM | POA: Diagnosis not present

## 2021-09-07 DIAGNOSIS — Z961 Presence of intraocular lens: Secondary | ICD-10-CM | POA: Diagnosis not present

## 2021-09-07 DIAGNOSIS — E119 Type 2 diabetes mellitus without complications: Secondary | ICD-10-CM | POA: Diagnosis not present

## 2021-10-06 DIAGNOSIS — E559 Vitamin D deficiency, unspecified: Secondary | ICD-10-CM | POA: Diagnosis not present

## 2021-10-06 DIAGNOSIS — E785 Hyperlipidemia, unspecified: Secondary | ICD-10-CM | POA: Diagnosis not present

## 2021-10-06 DIAGNOSIS — E039 Hypothyroidism, unspecified: Secondary | ICD-10-CM | POA: Diagnosis not present

## 2021-10-06 DIAGNOSIS — E1169 Type 2 diabetes mellitus with other specified complication: Secondary | ICD-10-CM | POA: Diagnosis not present

## 2021-10-06 DIAGNOSIS — I10 Essential (primary) hypertension: Secondary | ICD-10-CM | POA: Diagnosis not present

## 2021-10-06 DIAGNOSIS — M5432 Sciatica, left side: Secondary | ICD-10-CM | POA: Diagnosis not present

## 2021-10-09 ENCOUNTER — Other Ambulatory Visit: Payer: Self-pay | Admitting: Family Medicine

## 2021-10-09 DIAGNOSIS — Z1231 Encounter for screening mammogram for malignant neoplasm of breast: Secondary | ICD-10-CM

## 2021-10-20 ENCOUNTER — Ambulatory Visit: Payer: Medicare Other

## 2021-11-03 ENCOUNTER — Ambulatory Visit
Admission: RE | Admit: 2021-11-03 | Discharge: 2021-11-03 | Disposition: A | Discharge status: Home / Self Care | Payer: Medicare Other | Source: Ambulatory Visit | Attending: Family Medicine | Admitting: Family Medicine

## 2021-11-03 DIAGNOSIS — Z1231 Encounter for screening mammogram for malignant neoplasm of breast: Secondary | ICD-10-CM | POA: Diagnosis not present

## 2021-11-08 ENCOUNTER — Other Ambulatory Visit: Payer: Self-pay | Admitting: Family Medicine

## 2021-11-08 DIAGNOSIS — R928 Other abnormal and inconclusive findings on diagnostic imaging of breast: Secondary | ICD-10-CM

## 2021-12-01 ENCOUNTER — Ambulatory Visit: Payer: Medicare Other

## 2021-12-01 ENCOUNTER — Other Ambulatory Visit: Payer: Self-pay | Admitting: Family Medicine

## 2021-12-01 ENCOUNTER — Ambulatory Visit
Admission: RE | Admit: 2021-12-01 | Discharge: 2021-12-01 | Disposition: A | Discharge status: Home / Self Care | Payer: Medicare Other | Source: Ambulatory Visit | Attending: Family Medicine | Admitting: Family Medicine

## 2021-12-01 DIAGNOSIS — R928 Other abnormal and inconclusive findings on diagnostic imaging of breast: Secondary | ICD-10-CM

## 2021-12-01 DIAGNOSIS — N6323 Unspecified lump in the left breast, lower outer quadrant: Secondary | ICD-10-CM | POA: Diagnosis not present

## 2021-12-01 DIAGNOSIS — N6320 Unspecified lump in the left breast, unspecified quadrant: Secondary | ICD-10-CM

## 2021-12-07 ENCOUNTER — Encounter: Payer: Self-pay | Admitting: *Deleted

## 2021-12-08 ENCOUNTER — Telehealth: Payer: Self-pay | Admitting: Hematology and Oncology

## 2021-12-08 NOTE — Telephone Encounter (Signed)
Appt scheduled per 10/4 staff msg from navigator. Called pt, no answer. Left msg with appt date/time. Requested for pt to call back to confirm appt.

## 2021-12-11 ENCOUNTER — Ambulatory Visit: Payer: Medicare Other | Admitting: Hematology and Oncology

## 2021-12-15 DIAGNOSIS — C50412 Malignant neoplasm of upper-outer quadrant of left female breast: Secondary | ICD-10-CM | POA: Diagnosis not present

## 2021-12-15 DIAGNOSIS — Z170 Estrogen receptor positive status [ER+]: Secondary | ICD-10-CM | POA: Diagnosis not present

## 2021-12-18 ENCOUNTER — Other Ambulatory Visit: Payer: Self-pay

## 2021-12-18 ENCOUNTER — Other Ambulatory Visit: Payer: Self-pay | Admitting: General Surgery

## 2021-12-18 ENCOUNTER — Encounter (HOSPITAL_BASED_OUTPATIENT_CLINIC_OR_DEPARTMENT_OTHER): Payer: Self-pay | Admitting: General Surgery

## 2021-12-18 DIAGNOSIS — C50412 Malignant neoplasm of upper-outer quadrant of left female breast: Secondary | ICD-10-CM

## 2021-12-20 ENCOUNTER — Encounter (HOSPITAL_BASED_OUTPATIENT_CLINIC_OR_DEPARTMENT_OTHER)
Admission: RE | Admit: 2021-12-20 | Discharge: 2021-12-20 | Disposition: A | Discharge status: Home / Self Care | Payer: Medicare Other | Source: Ambulatory Visit | Attending: General Surgery | Admitting: General Surgery

## 2021-12-20 ENCOUNTER — Other Ambulatory Visit: Payer: Self-pay

## 2021-12-20 ENCOUNTER — Inpatient Hospital Stay: Payer: Medicare Other | Attending: Hematology and Oncology | Admitting: Hematology and Oncology

## 2021-12-20 DIAGNOSIS — Z01818 Encounter for other preprocedural examination: Secondary | ICD-10-CM | POA: Insufficient documentation

## 2021-12-20 DIAGNOSIS — E119 Type 2 diabetes mellitus without complications: Secondary | ICD-10-CM | POA: Diagnosis not present

## 2021-12-20 DIAGNOSIS — Z7984 Long term (current) use of oral hypoglycemic drugs: Secondary | ICD-10-CM | POA: Insufficient documentation

## 2021-12-20 DIAGNOSIS — Z170 Estrogen receptor positive status [ER+]: Secondary | ICD-10-CM

## 2021-12-20 DIAGNOSIS — Z79899 Other long term (current) drug therapy: Secondary | ICD-10-CM | POA: Diagnosis not present

## 2021-12-20 DIAGNOSIS — I10 Essential (primary) hypertension: Secondary | ICD-10-CM | POA: Diagnosis not present

## 2021-12-20 DIAGNOSIS — Z7982 Long term (current) use of aspirin: Secondary | ICD-10-CM | POA: Insufficient documentation

## 2021-12-20 DIAGNOSIS — E079 Disorder of thyroid, unspecified: Secondary | ICD-10-CM | POA: Insufficient documentation

## 2021-12-20 DIAGNOSIS — C50512 Malignant neoplasm of lower-outer quadrant of left female breast: Secondary | ICD-10-CM

## 2021-12-20 LAB — BASIC METABOLIC PANEL
Anion gap: 13 (ref 5–15)
BUN: 17 mg/dL (ref 8–23)
CO2: 25 mmol/L (ref 22–32)
Calcium: 10.1 mg/dL (ref 8.9–10.3)
Chloride: 103 mmol/L (ref 98–111)
Creatinine, Ser: 1.09 mg/dL — ABNORMAL HIGH (ref 0.44–1.00)
GFR, Estimated: 49 mL/min — ABNORMAL LOW (ref >60)
Glucose, Bld: 118 mg/dL — ABNORMAL HIGH (ref 70–99)
Potassium: 4.3 mmol/L (ref 3.5–5.1)
Sodium: 141 mmol/L (ref 135–145)

## 2021-12-20 MED ORDER — CHLORHEXIDINE GLUCONATE CLOTH 2 % EX PADS: 6.0000 | MEDICATED_PAD | Freq: Once | CUTANEOUS | Status: DC

## 2021-12-20 NOTE — Progress Notes (Signed)

## 2021-12-20 NOTE — Assessment & Plan Note (Addendum)
12/01/2021:Screening mammogram detected left breast mass 1.1 cm at 4 o'clock position: Biopsy: Grade 2 IDC with lobular features, ER 95%, PR 40%, Ki-67 10%, HER2 2+ by IHC and FISH negative ratio 1  Pathology and radiology counseling: Discussed with the patient, the details of pathology including the type of breast cancer,the clinical staging, the significance of ER, PR and HER-2/neu receptors and the implications for treatment. After reviewing the pathology in detail, we proceeded to discuss the different treatment options between surgery, radiation antiestrogen therapies.  Treatment plan: 1.  Breast conserving surgery 2. adjuvant antiestrogen therapy  Given her advanced age, radiation may be omitted Return to clinic after surgery to discuss final pathology report.

## 2021-12-20 NOTE — Progress Notes (Signed)
[facility] NOTE  Patient Care Team: Juanell Fairly, MD (Inactive) as PCP - General (Family Medicine) Mauro Kaufmann, RN as Oncology Nurse Navigator Rockwell Germany, RN as Oncology Nurse Navigator Nicholas Lose, MD as Consulting Physician (Hematology and Oncology)  CHIEF COMPLAINTS/PURPOSE OF CONSULTATION:  Newly diagnosed breast cancer  HISTORY OF PRESENTING ILLNESS:  Rhonda Singh 88 y.o. female is here because of recent diagnosis of left breast cancer.  This was detected on a routine screening mammogram.  This led to mammograms and ultrasounds and biopsies.  She saw Dr. Donne Hazel who is planning to do a lumpectomy on Monday.  She is accompanied by her daughter Juliann Pulse who works at the breast center and is here to discuss the overall treatment plan.  I reviewed her records extensively and collaborated the history with the patient.  SUMMARY OF ONCOLOGIC HISTORY: Oncology History  Malignant neoplasm of lower-outer quadrant of left breast of female, estrogen receptor positive ([place])  12/01/2021 Initial Diagnosis   Screening mammogram detected left breast mass 1.1 cm at 4 o'clock position: Biopsy: Grade 2 IDC with lobular features, ER 95%, PR 40%, Ki-67 10%, HER2 2+ by IHC and FISH negative ratio 1   12/20/2021 Cancer Staging   Staging form: Breast, AJCC 8th Edition - Clinical: Stage IA (cT1c, cN0, cM0, G2, ER+, PR+, HER2-) - Signed by Nicholas Lose, MD on 12/20/2021 Stage prefix: Initial diagnosis Histologic grading system: 3 grade system      MEDICAL HISTORY:  Past Medical History:  Diagnosis Date   Diabetes mellitus without complication ([employer])    Hypertension    Thyroid disease     SURGICAL HISTORY: Past Surgical History:  Procedure Laterality Date   CATARACT EXTRACTION Bilateral    TONSILLECTOMY AND ADENOIDECTOMY      SOCIAL HISTORY: Social History   Socioeconomic History   Marital status: Married    Spouse name: Not on file   Number of  children: Not on file   Years of education: Not on file   Highest education level: Not on file  Occupational History   Not on file  Tobacco Use   Smoking status: Never   Smokeless tobacco: Never  Substance and Sexual Activity   Alcohol use: Not Currently   Drug use: Not on file   Sexual activity: Not on file  Other Topics Concern   Not on file  Social History Narrative   Not on file   Social Determinants of Health   Financial Resource Strain: Not on file  Food Insecurity: Not on file  Transportation Needs: Not on file  Physical Activity: Not on file  Stress: Not on file  Social Connections: Not on file  Intimate Partner Violence: Not on file    FAMILY HISTORY: Family History  Problem Relation Age of Onset   Breast cancer Neg Hx     ALLERGIES:  has No Known Allergies.  MEDICATIONS:  Current Outpatient Medications  Medication Sig Dispense Refill   aspirin EC 81 MG tablet Take 81 mg by mouth at bedtime.     ezetimibe (ZETIA) 10 MG tablet Take 10 mg by mouth at bedtime.     levothyroxine (SYNTHROID, LEVOTHROID) 100 MCG tablet Take 100 mcg by mouth daily before breakfast.     lisinopril-hydrochlorothiazide (PRINZIDE,ZESTORETIC) 20-25 MG per tablet Take 1 tablet by mouth every morning.     METFORMIN HCL PO Take by mouth in the morning and at bedtime.     Multiple Vitamin (MULTIVITAMIN WITH MINERALS) TABS  Take 1 tablet by mouth daily.     Naphazoline HCl (CLEAR EYES OP) Apply 1 drop to eye daily as needed. For dry eyes     naproxen sodium (ANAPROX) 220 MG tablet Take 220 mg by mouth daily as needed. For pain     rosuvastatin (CRESTOR) 20 MG tablet Take 20 mg by mouth at bedtime.     No current facility-administered medications for this visit.   Facility-Administered Medications Ordered in Other Visits  Medication Dose Route Frequency Provider Last Rate Last Admin   Chlorhexidine Gluconate Cloth 2 % PADS 6 each  6 each Topical Once Rolm Bookbinder, MD       And    Chlorhexidine Gluconate Cloth 2 % PADS 6 each  6 each Topical Once Rolm Bookbinder, MD        REVIEW OF SYSTEMS:   Constitutional: Denies fevers, chills or abnormal night sweats   All other systems were reviewed with the patient and are negative.  PHYSICAL EXAMINATION: ECOG PERFORMANCE STATUS: 1 - Symptomatic but completely ambulatory  Vitals:   12/20/21 1517  BP: (!) 168/68  Pulse: (!) 104  Resp: 18  Temp: (!) 97.3 F (36.3 C)  SpO2: 98%   Filed Weights   12/20/21 1517  Weight: 146 lb 4.8 oz (66.4 kg)    GENERAL:alert, no distress and comfortable    LABORATORY DATA:  I have reviewed the data as listed Lab Results  Component Value Date   WBC 7.8 09/13/2012   HGB 11.2 (L) 09/13/2012   HCT 33.5 (L) 09/13/2012   MCV 97.4 09/13/2012   PLT 188 09/13/2012   Lab Results  Component Value Date   NA 144 09/15/2012   K 4.1 09/15/2012   CL 112 09/15/2012   CO2 26 09/15/2012    RADIOGRAPHIC STUDIES: I have personally reviewed the radiological reports and agreed with the findings in the report.  ASSESSMENT AND PLAN:  Malignant neoplasm of lower-outer quadrant of left breast of female, estrogen receptor positive ([place]) 12/01/2021:Screening mammogram detected left breast mass 1.1 cm at 4 o'clock position: Biopsy: Grade 2 IDC with lobular features, ER 95%, PR 40%, Ki-67 10%, HER2 2+ by IHC and FISH negative ratio 1  Pathology and radiology counseling: Discussed with the patient, the details of pathology including the type of breast cancer,the clinical staging, the significance of ER, PR and HER-2/neu receptors and the implications for treatment. After reviewing the pathology in detail, we proceeded to discuss the different treatment options between surgery, radiation antiestrogen therapies.  Treatment plan: 1.  Breast conserving surgery 2. adjuvant antiestrogen therapy  Given her advanced age, radiation may be omitted Return to clinic after surgery to discuss final  pathology report.  All questions were answered. The patient knows to call the clinic with any problems, questions or concerns.    Harriette Ohara, MD 12/20/21

## 2021-12-21 ENCOUNTER — Telehealth: Payer: Self-pay | Admitting: *Deleted

## 2021-12-21 ENCOUNTER — Encounter: Payer: Self-pay | Admitting: *Deleted

## 2021-12-21 NOTE — Telephone Encounter (Signed)
Left message for a return phone call to follow up from new patient visit and assess navigation needs.

## 2021-12-22 ENCOUNTER — Ambulatory Visit
Admission: RE | Admit: 2021-12-22 | Discharge: 2021-12-22 | Disposition: A | Discharge status: Home / Self Care | Payer: Medicare Other | Source: Ambulatory Visit | Attending: General Surgery | Admitting: General Surgery

## 2021-12-22 ENCOUNTER — Other Ambulatory Visit: Payer: Self-pay | Admitting: General Surgery

## 2021-12-22 DIAGNOSIS — C50412 Malignant neoplasm of upper-outer quadrant of left female breast: Secondary | ICD-10-CM

## 2021-12-22 DIAGNOSIS — C50912 Malignant neoplasm of unspecified site of left female breast: Secondary | ICD-10-CM | POA: Diagnosis not present

## 2021-12-25 ENCOUNTER — Encounter (HOSPITAL_BASED_OUTPATIENT_CLINIC_OR_DEPARTMENT_OTHER): Payer: Self-pay | Admitting: General Surgery

## 2021-12-25 ENCOUNTER — Other Ambulatory Visit: Payer: Self-pay

## 2021-12-25 ENCOUNTER — Ambulatory Visit (HOSPITAL_BASED_OUTPATIENT_CLINIC_OR_DEPARTMENT_OTHER)
Admission: RE | Admit: 2021-12-25 | Discharge: 2021-12-25 | Disposition: A | Discharge status: Home / Self Care | Payer: Medicare Other | Source: Ambulatory Visit | Attending: General Surgery | Admitting: General Surgery

## 2021-12-25 ENCOUNTER — Ambulatory Visit (HOSPITAL_BASED_OUTPATIENT_CLINIC_OR_DEPARTMENT_OTHER): Payer: Medicare Other | Admitting: Certified Registered"

## 2021-12-25 ENCOUNTER — Encounter (HOSPITAL_BASED_OUTPATIENT_CLINIC_OR_DEPARTMENT_OTHER)
Admission: RE | Disposition: A | Discharge status: Home / Self Care | Payer: Self-pay | Source: Ambulatory Visit | Attending: General Surgery

## 2021-12-25 ENCOUNTER — Ambulatory Visit
Admission: RE | Admit: 2021-12-25 | Discharge: 2021-12-25 | Disposition: A | Discharge status: Home / Self Care | Payer: Medicare Other | Source: Ambulatory Visit | Attending: General Surgery | Admitting: General Surgery

## 2021-12-25 DIAGNOSIS — C50412 Malignant neoplasm of upper-outer quadrant of left female breast: Secondary | ICD-10-CM

## 2021-12-25 DIAGNOSIS — D242 Benign neoplasm of left breast: Secondary | ICD-10-CM | POA: Insufficient documentation

## 2021-12-25 DIAGNOSIS — N6082 Other benign mammary dysplasias of left breast: Secondary | ICD-10-CM | POA: Insufficient documentation

## 2021-12-25 DIAGNOSIS — Z170 Estrogen receptor positive status [ER+]: Secondary | ICD-10-CM | POA: Insufficient documentation

## 2021-12-25 DIAGNOSIS — E1122 Type 2 diabetes mellitus with diabetic chronic kidney disease: Secondary | ICD-10-CM | POA: Diagnosis not present

## 2021-12-25 DIAGNOSIS — I10 Essential (primary) hypertension: Secondary | ICD-10-CM | POA: Diagnosis not present

## 2021-12-25 DIAGNOSIS — N6022 Fibroadenosis of left breast: Secondary | ICD-10-CM | POA: Insufficient documentation

## 2021-12-25 DIAGNOSIS — Z01818 Encounter for other preprocedural examination: Secondary | ICD-10-CM

## 2021-12-25 DIAGNOSIS — N6032 Fibrosclerosis of left breast: Secondary | ICD-10-CM | POA: Insufficient documentation

## 2021-12-25 DIAGNOSIS — Z7984 Long term (current) use of oral hypoglycemic drugs: Secondary | ICD-10-CM | POA: Diagnosis not present

## 2021-12-25 DIAGNOSIS — N6489 Other specified disorders of breast: Secondary | ICD-10-CM | POA: Insufficient documentation

## 2021-12-25 DIAGNOSIS — N62 Hypertrophy of breast: Secondary | ICD-10-CM | POA: Diagnosis not present

## 2021-12-25 DIAGNOSIS — Z79899 Other long term (current) drug therapy: Secondary | ICD-10-CM | POA: Diagnosis not present

## 2021-12-25 DIAGNOSIS — R928 Other abnormal and inconclusive findings on diagnostic imaging of breast: Secondary | ICD-10-CM | POA: Diagnosis not present

## 2021-12-25 DIAGNOSIS — E039 Hypothyroidism, unspecified: Secondary | ICD-10-CM | POA: Insufficient documentation

## 2021-12-25 DIAGNOSIS — N6012 Diffuse cystic mastopathy of left breast: Secondary | ICD-10-CM | POA: Diagnosis not present

## 2021-12-25 DIAGNOSIS — C50912 Malignant neoplasm of unspecified site of left female breast: Secondary | ICD-10-CM

## 2021-12-25 DIAGNOSIS — R920 Mammographic microcalcification found on diagnostic imaging of breast: Secondary | ICD-10-CM | POA: Diagnosis not present

## 2021-12-25 HISTORY — PX: BREAST LUMPECTOMY WITH RADIOACTIVE SEED LOCALIZATION: SHX6424

## 2021-12-25 HISTORY — DX: Type 2 diabetes mellitus without complications: E11.9

## 2021-12-25 LAB — GLUCOSE, CAPILLARY
Glucose-Capillary: 112 mg/dL — ABNORMAL HIGH (ref 70–99)
Glucose-Capillary: 112 mg/dL — ABNORMAL HIGH (ref 70–99)

## 2021-12-25 LAB — POCT PREGNANCY, URINE: Preg Test, Ur: NEGATIVE

## 2021-12-25 SURGERY — BREAST LUMPECTOMY WITH RADIOACTIVE SEED LOCALIZATION
Anesthesia: General | Site: Breast | Laterality: Left

## 2021-12-25 MED ORDER — ACETAMINOPHEN 500 MG PO TABS
1000.0000 mg | ORAL_TABLET | ORAL | Status: AC
  Administered 2021-12-25: 1000 mg via ORAL

## 2021-12-25 MED ORDER — PROPOFOL 500 MG/50ML IV EMUL
INTRAVENOUS | Status: DC | PRN
  Administered 2021-12-25: 125 ug/kg/min via INTRAVENOUS

## 2021-12-25 MED ORDER — FENTANYL CITRATE (PF) 100 MCG/2ML IJ SOLN
INTRAMUSCULAR | Status: AC
  Filled 2021-12-25: qty 2

## 2021-12-25 MED ORDER — ACETAMINOPHEN 500 MG PO TABS
ORAL_TABLET | ORAL | Status: AC
  Filled 2021-12-25: qty 2

## 2021-12-25 MED ORDER — CEFAZOLIN SODIUM-DEXTROSE 2-4 GM/100ML-% IV SOLN
INTRAVENOUS | Status: AC
  Filled 2021-12-25: qty 100

## 2021-12-25 MED ORDER — BUPIVACAINE HCL (PF) 0.25 % IJ SOLN
INTRAMUSCULAR | Status: DC | PRN
  Administered 2021-12-25: 20 mL

## 2021-12-25 MED ORDER — ENSURE PRE-SURGERY PO LIQD: 296.0000 mL | Freq: Once | ORAL | Status: DC

## 2021-12-25 MED ORDER — LIDOCAINE HCL (CARDIAC) PF 100 MG/5ML IV SOSY
PREFILLED_SYRINGE | INTRAVENOUS | Status: DC | PRN
  Administered 2021-12-25: 60 mg via INTRAVENOUS

## 2021-12-25 MED ORDER — LACTATED RINGERS IV SOLN: INTRAVENOUS | Status: DC

## 2021-12-25 MED ORDER — PROPOFOL 10 MG/ML IV BOLUS
INTRAVENOUS | Status: DC | PRN
  Administered 2021-12-25: 150 mg via INTRAVENOUS

## 2021-12-25 MED ORDER — ONDANSETRON HCL 4 MG/2ML IJ SOLN
INTRAMUSCULAR | Status: DC | PRN
  Administered 2021-12-25: 4 mg via INTRAVENOUS

## 2021-12-25 MED ORDER — CEFAZOLIN SODIUM-DEXTROSE 2-4 GM/100ML-% IV SOLN
2.0000 g | INTRAVENOUS | Status: AC
  Administered 2021-12-25: 2 g via INTRAVENOUS

## 2021-12-25 MED ORDER — AMISULPRIDE (ANTIEMETIC) 5 MG/2ML IV SOLN: 10.0000 mg | Freq: Once | INTRAVENOUS | Status: DC | PRN

## 2021-12-25 MED ORDER — ONDANSETRON HCL 4 MG/2ML IJ SOLN: 4.0000 mg | Freq: Once | INTRAMUSCULAR | Status: DC | PRN

## 2021-12-25 MED ORDER — DEXAMETHASONE SODIUM PHOSPHATE 4 MG/ML IJ SOLN
INTRAMUSCULAR | Status: DC | PRN
  Administered 2021-12-25: 4 mg via INTRAVENOUS

## 2021-12-25 MED ORDER — FENTANYL CITRATE (PF) 100 MCG/2ML IJ SOLN
INTRAMUSCULAR | Status: DC | PRN
  Administered 2021-12-25: 25 ug via INTRAVENOUS

## 2021-12-25 MED ORDER — FENTANYL CITRATE (PF) 100 MCG/2ML IJ SOLN: 25.0000 ug | INTRAMUSCULAR | Status: DC | PRN

## 2021-12-25 SURGICAL SUPPLY — 56 items
ADH SKN CLS APL DERMABOND .7 (GAUZE/BANDAGES/DRESSINGS) ×1
APL PRP STRL LF DISP 70% ISPRP (MISCELLANEOUS) ×1
APPLIER CLIP 9.375 MED OPEN (MISCELLANEOUS) ×1
APR CLP MED 9.3 20 MLT OPN (MISCELLANEOUS) ×1
BINDER BREAST LRG (GAUZE/BANDAGES/DRESSINGS) IMPLANT
BINDER BREAST MEDIUM (GAUZE/BANDAGES/DRESSINGS) IMPLANT
BINDER BREAST XLRG (GAUZE/BANDAGES/DRESSINGS) IMPLANT
BINDER BREAST XXLRG (GAUZE/BANDAGES/DRESSINGS) IMPLANT
BLADE SURG 15 STRL LF DISP TIS (BLADE) ×1 IMPLANT
BLADE SURG 15 STRL SS (BLADE) ×1
CANISTER SUC SOCK COL 7IN (MISCELLANEOUS) IMPLANT
CANISTER SUCT 1200ML W/VALVE (MISCELLANEOUS) IMPLANT
CHLORAPREP W/TINT 26 (MISCELLANEOUS) ×1 IMPLANT
CLIP APPLIE 9.375 MED OPEN (MISCELLANEOUS) IMPLANT
COVER BACK TABLE 60X90IN (DRAPES) ×1 IMPLANT
COVER MAYO STAND STRL (DRAPES) ×1 IMPLANT
COVER PROBE W GEL 5X96 (DRAPES) ×1 IMPLANT
DERMABOND ADVANCED .7 DNX12 (GAUZE/BANDAGES/DRESSINGS) ×1 IMPLANT
DRAPE LAPAROSCOPIC ABDOMINAL (DRAPES) ×1 IMPLANT
DRAPE UTILITY XL STRL (DRAPES) ×1 IMPLANT
DRSG TEGADERM 4X4.75 (GAUZE/BANDAGES/DRESSINGS) IMPLANT
ELECT COATED BLADE 2.86 ST (ELECTRODE) ×1 IMPLANT
ELECT REM PT RETURN 9FT ADLT (ELECTROSURGICAL) ×1
ELECTRODE REM PT RTRN 9FT ADLT (ELECTROSURGICAL) ×1 IMPLANT
GAUZE SPONGE 4X4 12PLY STRL LF (GAUZE/BANDAGES/DRESSINGS) IMPLANT
GLOVE BIO SURGEON STRL SZ 6.5 (GLOVE) IMPLANT
GLOVE BIO SURGEON STRL SZ7 (GLOVE) ×2 IMPLANT
GLOVE BIO SURGEON STRL SZ7.5 (GLOVE) IMPLANT
GLOVE BIOGEL PI IND STRL 7.0 (GLOVE) IMPLANT
GLOVE BIOGEL PI IND STRL 7.5 (GLOVE) ×1 IMPLANT
GOWN STRL REUS W/ TWL LRG LVL3 (GOWN DISPOSABLE) ×2 IMPLANT
GOWN STRL REUS W/TWL LRG LVL3 (GOWN DISPOSABLE) ×4
HEMOSTAT ARISTA ABSORB 3G PWDR (HEMOSTASIS) IMPLANT
KIT MARKER MARGIN INK (KITS) ×1 IMPLANT
NDL HYPO 25X1 1.5 SAFETY (NEEDLE) ×1 IMPLANT
NEEDLE HYPO 25X1 1.5 SAFETY (NEEDLE) ×1 IMPLANT
NS IRRIG 1000ML POUR BTL (IV SOLUTION) IMPLANT
PACK BASIN DAY SURGERY FS (CUSTOM PROCEDURE TRAY) ×1 IMPLANT
PENCIL SMOKE EVACUATOR (MISCELLANEOUS) ×1 IMPLANT
RETRACTOR ONETRAX LX 90X20 (MISCELLANEOUS) IMPLANT
SLEEVE SCD COMPRESS KNEE MED (STOCKING) ×1 IMPLANT
SPONGE T-LAP 4X18 ~~LOC~~+RFID (SPONGE) ×1 IMPLANT
STRIP CLOSURE SKIN 1/2X4 (GAUZE/BANDAGES/DRESSINGS) ×1 IMPLANT
SUT MNCRL AB 4-0 PS2 18 (SUTURE) ×1 IMPLANT
SUT MON AB 5-0 PS2 18 (SUTURE) IMPLANT
SUT SILK 2 0 SH (SUTURE) IMPLANT
SUT VIC AB 2-0 SH 27 (SUTURE) ×2
SUT VIC AB 2-0 SH 27XBRD (SUTURE) ×1 IMPLANT
SUT VIC AB 3-0 SH 27 (SUTURE) ×1
SUT VIC AB 3-0 SH 27X BRD (SUTURE) ×1 IMPLANT
SUT VIC AB 5-0 PS2 18 (SUTURE) IMPLANT
SYR CONTROL 10ML LL (SYRINGE) ×1 IMPLANT
TOWEL GREEN STERILE FF (TOWEL DISPOSABLE) ×1 IMPLANT
TRAY FAXITRON CT DISP (TRAY / TRAY PROCEDURE) ×1 IMPLANT
TUBE CONNECTING 20X1/4 (TUBING) IMPLANT
YANKAUER SUCT BULB TIP NO VENT (SUCTIONS) IMPLANT

## 2021-12-25 NOTE — Anesthesia Procedure Notes (Signed)
Procedure Name: LMA Insertion Date/Time: 12/25/2021 10:12 AM  Performed by: Der Gagliano, Ernesta Amble, CRNAPre-anesthesia Checklist: Patient identified, Emergency Drugs available, Suction available and Patient being monitored Patient Re-evaluated:Patient Re-evaluated prior to induction Oxygen Delivery Method: Circle system utilized Preoxygenation: Pre-oxygenation with 100% oxygen Induction Type: IV induction Ventilation: Mask ventilation without difficulty LMA: LMA inserted LMA Size: 4.0 Number of attempts: 1 Airway Equipment and Method: Bite block Placement Confirmation: positive ETCO2 Tube secured with: Tape Dental Injury: Teeth and Oropharynx as per pre-operative assessment

## 2021-12-25 NOTE — Transfer of Care (Signed)
Immediate Anesthesia Transfer of Care Note  Patient: Rhonda Singh  Procedure(s) Performed: LEFT BREAST LUMPECTOMY WITH RADIOACTIVE SEED LOCALIZATION (Left: Breast)  Patient Location: PACU  Anesthesia Type:General  Level of Consciousness: drowsy and patient cooperative  Airway & Oxygen Therapy: Patient Spontanous Breathing and Patient connected to face mask oxygen  Post-op Assessment: Report given to RN and Post -op Vital signs reviewed and stable  Post vital signs: Reviewed and stable  Last Vitals:  Vitals Value Taken Time  BP    Temp    Pulse 78 12/25/21 1050  Resp    SpO2 97 % 12/25/21 1050  Vitals shown include unvalidated device data.  Last Pain: There were no vitals filed for this visit.       Complications: No notable events documented.

## 2021-12-25 NOTE — Discharge Instructions (Addendum)
[place] Office Phone Number [phone]  POST OP INSTRUCTIONS Take 400 mg of ibuprofen every 8 hours or 650 mg tylenol every 6 hours for next 72 hours then as needed. (Starting at 2:40pm) Use ice several times daily also.  A prescription for pain medication may be given to you upon discharge.  Take your pain medication as prescribed, if needed.  If narcotic pain medicine is not needed, then you may take acetaminophen (Tylenol), naprosyn (Alleve) or ibuprofen (Advil) as needed. Take your usually prescribed medications unless otherwise directed If you need a refill on your pain medication, please contact your pharmacy.  They will contact our office to request authorization.  Prescriptions will not be filled after 5pm or on week-ends. You should eat very light the first 24 hours after surgery, such as soup, crackers, pudding, etc.  Resume your normal diet the day after surgery. Most patients will experience some swelling and bruising in the breast.  Ice packs and a good support bra will help.  Wear the breast binder provided or a sports bra for 72 hours day and night.  After that wear a sports bra during the day until you return to the office. Swelling and bruising can take several days to resolve.  It is common to experience some constipation if taking pain medication after surgery.  Increasing fluid intake and taking a stool softener will usually help or prevent this problem from occurring.  A mild laxative (Milk of Magnesia or Miralax) should be taken according to package directions if there are no bowel movements after 48 hours. I used skin glue on the incision, you may shower in 24 hours.  The glue will flake off over the next 2-3 weeks.  Any sutures or staples will be removed at the office during your follow-up visit. ACTIVITIES:  You may resume regular daily activities (gradually increasing) beginning the next day.  Wearing a good support bra or sports bra minimizes pain and  swelling.  You may have sexual intercourse when it is comfortable. You may drive when you no longer are taking prescription pain medication, you can comfortably wear a seatbelt, and you can safely maneuver your car and apply brakes. RETURN TO WORK:  ______________________________________________________________________________________ Rhonda Singh Bast should see your doctor in the office for a follow-up appointment approximately two weeks after your surgery.  Your doctor's nurse will typically make your follow-up appointment when she calls you with your pathology report.  Expect your pathology report 3-4 business days after your surgery.  You may call to check if you do not hear from [place] after three days. OTHER INSTRUCTIONS: _______________________________________________________________________________________________ _____________________________________________________________________________________________________________________________________ _____________________________________________________________________________________________________________________________________ _____________________________________________________________________________________________________________________________________  WHEN TO CALL DR WAKEFIELD: Fever over 101.0 Nausea and/or vomiting. Extreme swelling or bruising. Continued bleeding from incision. Increased pain, redness, or drainage from the incision.  The clinic staff is available to answer your questions during regular business hours.  Please don't hesitate to call and ask to speak to one of the nurses for clinical concerns.  If you have a medical emergency, go to the nearest emergency room or call 911.  A surgeon from [facility] is always on call at the hospital.  For further questions, please visit [email or web address]   Post Anesthesia Home Care Instructions  Activity: Get plenty of rest for the remainder of the day. A responsible  individual must stay with you for 24 hours following the procedure.  For the next 24 hours, DO NOT: -Drive a car -Paediatric nurse -Drink alcoholic beverages -Take any medication  unless instructed by your physician -Make any legal decisions or sign important papers.  Meals: Start with liquid foods such as gelatin or soup. Progress to regular foods as tolerated. Avoid greasy, spicy, heavy foods. If nausea and/or vomiting occur, drink only clear liquids until the nausea and/or vomiting subsides. Call your physician if vomiting continues.  Special Instructions/Symptoms: Your throat may feel dry or sore from the anesthesia or the breathing tube placed in your throat during surgery. If this causes discomfort, gargle with warm salt water. The discomfort should disappear within 24 hours.  If you had a scopolamine patch placed behind your ear for the management of post- operative nausea and/or vomiting:  1. The medication in the patch is effective for 72 hours, after which it should be removed.  Wrap patch in a tissue and discard in the trash. Wash hands thoroughly with soap and water. 2. You may remove the patch earlier than 72 hours if you experience unpleasant side effects which may include dry mouth, dizziness or visual disturbances. 3. Avoid touching the patch. Wash your hands with soap and water after contact with the patch.

## 2021-12-25 NOTE — Anesthesia Preprocedure Evaluation (Addendum)
Anesthesia Evaluation  Patient identified by MRN, date of birth, ID band Patient awake    Reviewed: Allergy & Precautions, NPO status , Patient's Chart, lab work & pertinent test results  Airway Mallampati: II  TM Distance: >3 FB Neck ROM: Full    Dental no notable dental hx.    Pulmonary neg pulmonary ROS,    Pulmonary exam normal        Cardiovascular hypertension, Pt. on medications Normal cardiovascular exam     Neuro/Psych negative neurological ROS  negative psych ROS   GI/Hepatic negative GI ROS, Neg liver ROS,   Endo/Other  diabetes, Oral Hypoglycemic AgentsHypothyroidism   Renal/GU Renal disease     Musculoskeletal negative musculoskeletal ROS (+)   Abdominal   Peds  Hematology negative hematology ROS (+)   Anesthesia Other Findings LEFT BREAST CANCER  Reproductive/Obstetrics                           Anesthesia Physical Anesthesia Plan  ASA: 2  Anesthesia Plan: General   Post-op Pain Management:    Induction: Intravenous  PONV Risk Score and Plan: 3 and Ondansetron, Dexamethasone and Treatment may vary due to age or medical condition  Airway Management Planned: LMA  Additional Equipment:   Intra-op Plan:   Post-operative Plan: Extubation in OR  Informed Consent: I have reviewed the patients History and Physical, chart, labs and discussed the procedure including the risks, benefits and alternatives for the proposed anesthesia with the patient or authorized representative who has indicated his/her understanding and acceptance.     Dental advisory given  Plan Discussed with: CRNA  Anesthesia Plan Comments:         Anesthesia Quick Evaluation

## 2021-12-25 NOTE — Interval H&P Note (Signed)
History and Physical Interval Note:  12/25/2021 9:24 AM  Rhonda Singh  has presented today for surgery, with the diagnosis of LEFT BREAST CANCER.  The various methods of treatment have been discussed with the patient and family. After consideration of risks, benefits and other options for treatment, the patient has consented to  Procedure(s): LEFT BREAST LUMPECTOMY WITH RADIOACTIVE SEED LOCALIZATION (Left) as a surgical intervention.  The patient's history has been reviewed, patient examined, no change in status, stable for surgery.  I have reviewed the patient's chart and labs.  Questions were answered to the patient's satisfaction.     Rolm Bookbinder

## 2021-12-25 NOTE — H&P (Signed)
88 year old female with no prior breast history. She had no mass or any discharge. She had a screening mammogram that shows B density breast. She was found to have a right-sided area that cleared on diagnostic views the left side shows a mass of 1.3 cm that on ultrasound is 1.1 cm. Ultrasound of her axilla is negative. This was biopsied is a grade 2 invasive ductal carcinoma that is 95% ER positive, 40% PR positive, HER2 negative, and the proliferation index is 10%. She is here with her daughter Juliann Pulse to discuss her options today.  Review of Systems: A complete review of systems was obtained from the patient. I have reviewed this information and discussed as appropriate with the patient. See HPI as well for other ROS.  Review of Systems  Respiratory: Positive for cough.  Endo/Heme/Allergies: Bruises/bleeds easily.   Medical History: Past Medical History:  Diagnosis Date  Arthritis  Chronic kidney disease  Diabetes mellitus without complication (CMS-HCC)  GERD (gastroesophageal reflux disease)  Hyperlipidemia  Hypertension  Liver disease  Thyroid disease   Past Surgical History:  Procedure Laterality Date  CATARACT EXTRACTION  tonsillectomy   No Known Allergies  Current Outpatient Medications on File Prior to Visit  Medication Sig Dispense Refill  cinacalcet (SENSIPAR) 30 MG tablet Take 1 tablet by mouth once daily  ezetimibe (ZETIA) 10 mg tablet  levothyroxine (SYNTHROID) 88 MCG tablet Take by mouth  lisinopriL (ZESTRIL) 20 MG tablet Take 1 tablet by mouth once daily  metFORMIN (GLUCOPHAGE) 1000 MG tablet  rosuvastatin (CRESTOR) 20 MG tablet  aspirin 81 MG EC tablet Take by mouth  cholecalciferol (VITAMIN D3) 2,000 unit capsule 2 capsules  cyanocobalamin (VITAMIN B12) 1000 MCG tablet Take by mouth   Family History  Problem Relation Age of Onset  Stroke Father  Hyperlipidemia (Elevated cholesterol) Sister  High blood pressure (Hypertension) Sister  Diabetes Brother   Coronary Artery Disease (Blocked arteries around heart) Brother  Hyperlipidemia (Elevated cholesterol) Brother  High blood pressure (Hypertension) Brother  Stroke Brother    Social History   Tobacco Use  Smoking Status Never  Smokeless Tobacco Never  Marital status: Married  Tobacco Use  Smoking status: Never  Smokeless tobacco: Never  Substance and Sexual Activity  Alcohol use: Never  Drug use: Never   Objective:   Vitals:  12/15/21 1015  BP: (!) 148/80  Pulse: 110  Temp: 36.7 C (98 F)  SpO2: 97%  Weight: 67 kg (147 lb 9.6 oz)  Height: 158.8 cm (5' 2.5")   Body mass index is 26.57 kg/m.  Physical Exam Vitals reviewed.  Constitutional:  Appearance: Normal appearance.  Chest:  Breasts: Right: No inverted nipple, mass or nipple discharge.  Left: No inverted nipple, mass or nipple discharge.  Lymphadenopathy:  Upper Body:  Right upper body: No supraclavicular or axillary adenopathy.  Left upper body: No supraclavicular or axillary adenopathy.  Neurological:  Mental Status: She is alert.    Assessment and Plan:   Malignant neoplasm of upper-outer quadrant of left breast in female, estrogen receptor positive   Left breast seed guided lumpectomy  We discussed the staging and pathophysiology of breast cancer. We discussed all of the different options for treatment for breast cancer including surgery, chemotherapy, radiation therapy, Herceptin, and antiestrogen therapy. I do think surgery is reasonable at age 78 due to her overall health status. Dont think just antiestrogens is best option. She does not need a sn biopsy for this.  We discussed the options for treatment of the breast cancer  which included lumpectomy versus a mastectomy. We discussed the performance of the lumpectomy with radioactive seed placement. We discussed a 5-10% chance of a positive margin requiring reexcision in the operating room. She may very well be able to omit radiotherapy as well. We  discussed that there is no difference in her survival whether she undergoes lumpectomy with radiation therapy or antiestrogen therapy versus a mastectomy. There is also no real difference between her recurrence in the breast. We discussed the risks of operation including bleeding, infection, possible reoperation. She understands her further therapy will be based on what her stages at the time of her operation.

## 2021-12-25 NOTE — Anesthesia Postprocedure Evaluation (Signed)
Anesthesia Post Note  Patient: Rhonda Singh  Procedure(s) Performed: LEFT BREAST LUMPECTOMY WITH RADIOACTIVE SEED LOCALIZATION (Left: Breast)     Patient location during evaluation: PACU Anesthesia Type: General Level of consciousness: awake Pain management: pain level controlled Vital Signs Assessment: post-procedure vital signs reviewed and stable Respiratory status: spontaneous breathing, nonlabored ventilation, respiratory function stable and patient connected to nasal cannula oxygen Cardiovascular status: blood pressure returned to baseline and stable Postop Assessment: no apparent nausea or vomiting Anesthetic complications: no   No notable events documented.  Last Vitals:  Vitals:   12/25/21 1100 12/25/21 1130  BP: (!) 131/57 (!) 149/73  Pulse: 79 85  Resp: 18 16  Temp:  36.4 C  SpO2: 98% 96%    Last Pain:  Vitals:   12/25/21 1130  PainSc: 0-No pain                 Vartan Kerins P Tinika Bucknam

## 2021-12-25 NOTE — Op Note (Signed)
Preoperative diagnosis: Clinical stage I left breast cancer Postoperative diagnosis: Same as above Procedure: Left breast radioactive seed guided lumpectomy Surgeon: Dr. Serita Grammes Anesthesia: General Estimated blood loss: Minimal Complications: None Drains: None Specimens: 1.  Left breast radioactive seed guided lumpectomy containing seed and clip 2.  Additional superior, lateral, medial, inferior, and posterior margins marked short superior, long lateral, double deep Sponge and count was correct at completion Disposition to recovery stable condition  Indications:88 year old female with no prior breast history. She had no mass or any discharge. She had a screening mammogram that shows B density breast. She was found to have a right-sided area that cleared on diagnostic views the left side shows a mass of 1.3 cm that on ultrasound is 1.1 cm. Ultrasound of her axilla is negative. This was biopsied is a grade 2 invasive ductal carcinoma that is 95% ER positive, 40% PR positive, HER2 negative, and the proliferation index is 10%.  We discussed all of the options including observation and elected proceed with lumpectomy alone.  Procedure: After informed consent was obtained she was taken to the operating room.  She had a seed placed prior to beginning I had these mammograms in the operating room.  She was given antibiotics.  SCDs were in place.  She was placed under general anesthesia with an LMA.  She was prepped and draped in a standard sterile surgical fashion.  A surgical timeout was then performed.  I located the seed in the lower outer quadrant.  I made a periareolar incision to hide the scar later.  I infiltrated Marcaine throughout this area.  I then made an incision and used the neoprobe to guide excision of the seed and the surrounding tissue with attempted get a clear margin.  Mammogram confirmed removal of the clip and the seed.  I did a CAT scan and thought I might be close to margin so  I removed all the additional margins except the anterior margin which appeared to be clear.  I then obtained hemostasis.  I then closed the breast tissue with 2-0 Vicryl.  Skin was closed with 3-0 Vicryl and 5-0 Monocryl.  Glue and Steri-Strips were applied.  She tolerated this well was extubated and transferred to recovery stable.

## 2021-12-26 ENCOUNTER — Encounter (HOSPITAL_BASED_OUTPATIENT_CLINIC_OR_DEPARTMENT_OTHER): Payer: Self-pay | Admitting: General Surgery

## 2021-12-27 LAB — SURGICAL PATHOLOGY

## 2022-01-01 NOTE — Progress Notes (Signed)
Patient Care Team: Juanell Fairly, MD (Inactive) as PCP - General (Family Medicine) Mauro Kaufmann, RN as Oncology Nurse Navigator Rockwell Germany, RN as Oncology Nurse Navigator Nicholas Lose, MD as Consulting Physician (Hematology and Oncology) Rolm Bookbinder, MD as Consulting Physician (General Surgery)  DIAGNOSIS:  Encounter Diagnosis  Name Primary?   Malignant neoplasm of lower-outer quadrant of left breast of female, estrogen receptor positive ([place]) Yes    SUMMARY OF ONCOLOGIC HISTORY: Oncology History  Malignant neoplasm of lower-outer quadrant of left breast of female, estrogen receptor positive ([place])  12/01/2021 Initial Diagnosis   Screening mammogram detected left breast mass 1.1 cm at 4 o'clock position: Biopsy: Grade 2 IDC with lobular features, ER 95%, PR 40%, Ki-67 10%, HER2 2+ by IHC and FISH negative ratio 1   12/20/2021 Cancer Staging   Staging form: Breast, AJCC 8th Edition - Clinical: Stage IA (cT1c, cN0, cM0, G2, ER+, PR+, HER2-) - Signed by Nicholas Lose, MD on 12/20/2021 Stage prefix: Initial diagnosis Histologic grading system: 3 grade system     CHIEF COMPLIANT: Follow-up after surgery  INTERVAL HISTORY: Rhonda Singh is a 88 y.o. female is here because of recent diagnosis of left breast cancer. She presents to the clinic for a follow-up.  She tolerated surgery extremely well without any problems or concerns.  She is accompanied today by her daughter.  Both she and her daughter were diagnosed with hyperparathyroidism.  Patient is being managed medically.    ALLERGIES:  has No Known Allergies.  MEDICATIONS:  Current Outpatient Medications  Medication Sig Dispense Refill   aspirin EC 81 MG tablet Take 81 mg by mouth at bedtime.     Cholecalciferol 50 MCG (2000 UT) CAPS Take 2,000 Units by mouth in the morning and at bedtime.     cinacalcet (SENSIPAR) 30 MG tablet Take 30 mg by mouth daily.     cyanocobalamin (VITAMIN B12) 1000 MCG tablet  Take 1,000 mcg by mouth daily.     ezetimibe (ZETIA) 10 MG tablet Take 10 mg by mouth at bedtime.     levothyroxine (SYNTHROID) 88 MCG tablet Take 88 mcg by mouth daily before breakfast.     lisinopril (ZESTRIL) 20 MG tablet Take 1 tablet by mouth daily.     lisinopril-hydrochlorothiazide (PRINZIDE,ZESTORETIC) 20-25 MG per tablet Take 1 tablet by mouth every morning.     METFORMIN HCL PO Take 1,000 mg by mouth in the morning and at bedtime.     naproxen sodium (ANAPROX) 220 MG tablet Take 220 mg by mouth daily as needed. For pain     rosuvastatin (CRESTOR) 20 MG tablet Take 20 mg by mouth at bedtime.     No current facility-administered medications for this visit.    PHYSICAL EXAMINATION: ECOG PERFORMANCE STATUS: 1 - Symptomatic but completely ambulatory  Vitals:   01/08/22 1440  BP: (!) 184/70  Pulse: 97  Resp: 18  Temp: 97.8 F (36.6 C)  SpO2: 98%   Filed Weights   01/08/22 1440  Weight: 148 lb 9.6 oz (67.4 kg)      LABORATORY DATA:  I have reviewed the data as listed    Latest Ref Rng & Units 12/20/2021    2:03 PM 09/15/2012    5:00 AM 09/14/2012    5:05 AM  CMP  Glucose 70 - 99 mg/dL 118  109  123   BUN 8 - 23 mg/dL _0 Creatinine 0.44 - 1.00 mg/dL 1.09  0.82  0.89   Sodium 135 - 145 mmol/L 141  144  141   Potassium 3.5 - 5.1 mmol/L 4.3  4.1  4.1   Chloride 98 - 111 mmol/L 103  112  113   CO2 22 - 32 mmol/L _0 Calcium 8.9 - 10.3 mg/dL 10.1  9.6  9.4     Lab Results  Component Value Date   WBC 7.8 09/13/2012   HGB 11.2 (L) 09/13/2012   HCT 33.5 (L) 09/13/2012   MCV 97.4 09/13/2012   PLT 188 09/13/2012   NEUTROABS 5.0 09/11/2012    ASSESSMENT & PLAN:  Malignant neoplasm of lower-outer quadrant of left breast of female, estrogen receptor positive ([place]) 12/01/2021:Screening mammogram detected left breast mass 1.1 cm at 4 o'clock position: Biopsy: Grade 2 IDC with lobular features, ER 95%, PR 40%, Ki-67 10%, HER2 2+ by IHC and FISH negative  ratio 1   12/25/21: Left Lumpectomy: Grade 1 IDC with focal DCIS 0.9 cm, Margins Neg, ER 95%, PR 40%, Her 2 Neg, KI 67: 10%  Pathology counseling: I discussed the final pathology report of the patient provided  a copy of this report. I discussed the margins as well as lymph node surgeries. We also discussed the final staging along with previously performed ER/PR and HER-2/neu testing.  Treatment Plan: After discussing the pros and cons of antiestrogen therapy we decided not to initiate the treatment and to be watched and monitored.  This is based on her age as well as the low risk nature of her disease.   RTC in 6 months for SCP visit and after that we can see her annually.    No orders of the defined types were placed in this encounter.  The patient has a good understanding of the overall plan. she agrees with it. she will call with any problems that may develop before the next visit here. Total time spent: 30 mins including face to face time and time spent for planning, charting and co-ordination of care   Harriette Ohara, MD 01/08/22    I Gardiner Coins am scribing for Dr. Lindi Adie  I have reviewed the above documentation for accuracy and completeness, and I agree with the above.

## 2022-01-08 ENCOUNTER — Inpatient Hospital Stay: Payer: Medicare Other | Attending: Hematology and Oncology | Admitting: Hematology and Oncology

## 2022-01-08 ENCOUNTER — Other Ambulatory Visit: Payer: Self-pay

## 2022-01-08 VITALS — BP 184/70 | HR 97 | Temp 97.8°F | Resp 18 | Ht 62.5 in | Wt 148.6 lb

## 2022-01-08 DIAGNOSIS — Z7982 Long term (current) use of aspirin: Secondary | ICD-10-CM | POA: Insufficient documentation

## 2022-01-08 DIAGNOSIS — Z79899 Other long term (current) drug therapy: Secondary | ICD-10-CM | POA: Diagnosis not present

## 2022-01-08 DIAGNOSIS — C50512 Malignant neoplasm of lower-outer quadrant of left female breast: Secondary | ICD-10-CM | POA: Insufficient documentation

## 2022-01-08 DIAGNOSIS — Z7984 Long term (current) use of oral hypoglycemic drugs: Secondary | ICD-10-CM | POA: Insufficient documentation

## 2022-01-08 DIAGNOSIS — E213 Hyperparathyroidism, unspecified: Secondary | ICD-10-CM | POA: Insufficient documentation

## 2022-01-08 DIAGNOSIS — Z170 Estrogen receptor positive status [ER+]: Secondary | ICD-10-CM | POA: Diagnosis not present

## 2022-01-08 NOTE — Assessment & Plan Note (Signed)
12/01/2021:Screening mammogram detected left breast mass 1.1 cm at 4 o'clock position: Biopsy: Grade 2 IDC with lobular features, ER 95%, PR 40%, Ki-67 10%, HER2 2+ by IHC and FISH negative ratio 1   12/25/21: Left Lumpectomy: Grade 1 IDC with focal DCIS 0.9 cm, Margins Neg, ER 95%, PR 40%, Her 2 Neg, KI 67: 10%  Pathology counseling: I discussed the final pathology report of the patient provided  a copy of this report. I discussed the margins as well as lymph node surgeries. We also discussed the final staging along with previously performed ER/PR and HER-2/neu testing.  Treatment Plan: Start anti-estrogen therapy with Anastrozole 1 mg daily  Anastrozole counseling: We discussed the risks and benefits of anti-estrogen therapy with aromatase inhibitors. These include but not limited to insomnia, hot flashes, mood changes, vaginal dryness, bone density loss, and weight gain. We strongly believe that the benefits far outweigh the risks. Patient understands these risks and consented to starting treatment. Planned treatment duration is 5 years.  RTC in 3 months for SCP visit

## 2022-01-09 ENCOUNTER — Encounter: Payer: Self-pay | Admitting: *Deleted

## 2022-01-09 DIAGNOSIS — Z170 Estrogen receptor positive status [ER+]: Secondary | ICD-10-CM

## 2022-01-23 DIAGNOSIS — E213 Hyperparathyroidism, unspecified: Secondary | ICD-10-CM | POA: Diagnosis not present

## 2022-01-23 DIAGNOSIS — E039 Hypothyroidism, unspecified: Secondary | ICD-10-CM | POA: Diagnosis not present

## 2022-01-23 DIAGNOSIS — M810 Age-related osteoporosis without current pathological fracture: Secondary | ICD-10-CM | POA: Diagnosis not present

## 2022-01-30 DIAGNOSIS — E213 Hyperparathyroidism, unspecified: Secondary | ICD-10-CM | POA: Diagnosis not present

## 2022-01-30 DIAGNOSIS — E039 Hypothyroidism, unspecified: Secondary | ICD-10-CM | POA: Diagnosis not present

## 2022-01-30 DIAGNOSIS — M8580 Other specified disorders of bone density and structure, unspecified site: Secondary | ICD-10-CM | POA: Diagnosis not present

## 2022-02-14 DIAGNOSIS — I10 Essential (primary) hypertension: Secondary | ICD-10-CM | POA: Diagnosis not present

## 2022-02-14 DIAGNOSIS — E039 Hypothyroidism, unspecified: Secondary | ICD-10-CM | POA: Diagnosis not present

## 2022-02-14 DIAGNOSIS — E785 Hyperlipidemia, unspecified: Secondary | ICD-10-CM | POA: Diagnosis not present

## 2022-02-14 DIAGNOSIS — C50919 Malignant neoplasm of unspecified site of unspecified female breast: Secondary | ICD-10-CM | POA: Diagnosis not present

## 2022-02-14 DIAGNOSIS — M8588 Other specified disorders of bone density and structure, other site: Secondary | ICD-10-CM | POA: Diagnosis not present

## 2022-02-14 DIAGNOSIS — E1169 Type 2 diabetes mellitus with other specified complication: Secondary | ICD-10-CM | POA: Diagnosis not present

## 2022-02-14 DIAGNOSIS — Z23 Encounter for immunization: Secondary | ICD-10-CM | POA: Diagnosis not present

## 2022-02-14 DIAGNOSIS — Z0000 Encounter for general adult medical examination without abnormal findings: Secondary | ICD-10-CM | POA: Diagnosis not present

## 2022-02-20 ENCOUNTER — Other Ambulatory Visit: Payer: Self-pay | Admitting: Internal Medicine

## 2022-02-20 DIAGNOSIS — E213 Hyperparathyroidism, unspecified: Secondary | ICD-10-CM

## 2022-02-20 DIAGNOSIS — M8580 Other specified disorders of bone density and structure, unspecified site: Secondary | ICD-10-CM

## 2022-03-22 ENCOUNTER — Encounter (HOSPITAL_COMMUNITY): Payer: Self-pay

## 2022-06-14 DIAGNOSIS — E1169 Type 2 diabetes mellitus with other specified complication: Secondary | ICD-10-CM | POA: Diagnosis not present

## 2022-06-14 DIAGNOSIS — E039 Hypothyroidism, unspecified: Secondary | ICD-10-CM | POA: Diagnosis not present

## 2022-06-14 DIAGNOSIS — E212 Other hyperparathyroidism: Secondary | ICD-10-CM | POA: Diagnosis not present

## 2022-06-14 DIAGNOSIS — E559 Vitamin D deficiency, unspecified: Secondary | ICD-10-CM | POA: Diagnosis not present

## 2022-06-14 DIAGNOSIS — E1121 Type 2 diabetes mellitus with diabetic nephropathy: Secondary | ICD-10-CM | POA: Diagnosis not present

## 2022-06-14 DIAGNOSIS — Z853 Personal history of malignant neoplasm of breast: Secondary | ICD-10-CM | POA: Diagnosis not present

## 2022-06-14 DIAGNOSIS — I10 Essential (primary) hypertension: Secondary | ICD-10-CM | POA: Diagnosis not present

## 2022-07-10 ENCOUNTER — Encounter: Payer: Self-pay | Admitting: Adult Health

## 2022-07-10 ENCOUNTER — Other Ambulatory Visit: Payer: Self-pay

## 2022-07-10 ENCOUNTER — Inpatient Hospital Stay: Payer: Medicare Other | Attending: Adult Health | Admitting: Adult Health

## 2022-07-10 VITALS — BP 179/57 | HR 105 | Temp 98.7°F | Resp 18 | Ht 62.5 in | Wt 145.0 lb

## 2022-07-10 DIAGNOSIS — Z79899 Other long term (current) drug therapy: Secondary | ICD-10-CM | POA: Diagnosis not present

## 2022-07-10 DIAGNOSIS — Z853 Personal history of malignant neoplasm of breast: Secondary | ICD-10-CM | POA: Diagnosis not present

## 2022-07-10 DIAGNOSIS — Z923 Personal history of irradiation: Secondary | ICD-10-CM | POA: Diagnosis not present

## 2022-07-10 DIAGNOSIS — Z7982 Long term (current) use of aspirin: Secondary | ICD-10-CM | POA: Diagnosis not present

## 2022-07-10 DIAGNOSIS — Z79890 Hormone replacement therapy (postmenopausal): Secondary | ICD-10-CM | POA: Insufficient documentation

## 2022-07-10 DIAGNOSIS — Z170 Estrogen receptor positive status [ER+]: Secondary | ICD-10-CM | POA: Diagnosis not present

## 2022-07-10 DIAGNOSIS — Z7984 Long term (current) use of oral hypoglycemic drugs: Secondary | ICD-10-CM | POA: Diagnosis not present

## 2022-07-10 DIAGNOSIS — C50512 Malignant neoplasm of lower-outer quadrant of left female breast: Secondary | ICD-10-CM | POA: Diagnosis not present

## 2022-07-10 NOTE — Progress Notes (Signed)
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, estrogen receptor positive (HCC)  12/01/2021 Initial Diagnosis   Screening mammogram detected left breast mass 1.1 cm at 4 o'clock position: Biopsy: Grade 2 IDC with lobular features, ER 95%, PR 40%, Ki-67 10%, HER2 2+ by IHC and FISH negative ratio 1   12/20/2021 Cancer Staging   Staging form: Breast, AJCC 8th Edition - Clinical: Stage IA (cT1c, cN0, cM0, G2, ER+, PR+, HER2-) - Signed by Serena Croissant, MD on 12/20/2021 Stage prefix: Initial diagnosis Histologic grading system: 3 grade system   12/25/2021 Surgery   Left Lumpectomy: Grade 1 IDC with focal DCIS 0.9 cm, Margins Neg, ER 95%, PR 40%, Her 2 Neg, KI 67: 10%    01/2022 -  Anti-estrogen oral therapy   Anastrozole--opted to forego     INTERVAL HISTORY:  Ms. Kersten to review her survivorship care plan detailing her treatment course for breast cancer, as well as monitoring long-term side effects of that treatment, education regarding health maintenance, screening, and overall wellness and health promotion.     Overall, Ms. Roed reports feeling quite well.  She tells me that she is healing well from radiation therapy.  She denies any significant issues, and is here accompanied by her daughter, Osborne Oman.   REVIEW OF SYSTEMS:  Review of Systems  Constitutional:  Negative for appetite change, chills, fatigue, fever and unexpected weight change.  HENT:   Negative for hearing loss, lump/mass and trouble swallowing.   Eyes:  Negative for eye problems and icterus.  Respiratory:  Negative for chest tightness, cough and shortness of breath.   Cardiovascular:  Negative for chest pain, leg swelling and palpitations.  Gastrointestinal:  Negative for abdominal distention, abdominal pain, constipation, diarrhea, nausea and vomiting.  Endocrine: Negative for hot flashes.  Genitourinary:  Negative  for difficulty urinating.   Musculoskeletal:  Negative for arthralgias.  Skin:  Negative for itching and rash.  Neurological:  Negative for dizziness, extremity weakness, headaches and numbness.  Hematological:  Negative for adenopathy. Does not bruise/bleed easily.  Psychiatric/Behavioral:  Negative for depression. The patient is not nervous/anxious.    Breast: Denies any new nodularity, masses, tenderness, nipple changes, or nipple discharge.      PAST MEDICAL/SURGICAL HISTORY:  Past Medical History:  Diagnosis Date   Diabetes mellitus without complication (HCC)    Hypertension    Thyroid disease    Past Surgical History:  Procedure Laterality Date   BREAST LUMPECTOMY WITH RADIOACTIVE SEED LOCALIZATION Left 12/25/2021   Procedure: LEFT BREAST LUMPECTOMY WITH RADIOACTIVE SEED LOCALIZATION;  Surgeon: Emelia Loron, MD;  Location: [facility];  Service: General;  Laterality: Left;   CATARACT EXTRACTION Bilateral    TONSILLECTOMY AND ADENOIDECTOMY       ALLERGIES:  No Known Allergies   CURRENT MEDICATIONS:  Outpatient Encounter Medications as of 07/10/2022  Medication Sig   aspirin EC 81 MG tablet Take 81 mg by mouth at bedtime.   Cholecalciferol 50 MCG (2000 UT) CAPS Take 2,000 Units by mouth in the morning and at bedtime.   cinacalcet (SENSIPAR) 30 MG tablet Take 30 mg by mouth daily.   cyanocobalamin (VITAMIN B12) 1000 MCG tablet Take 1,000 mcg by mouth daily.   ezetimibe (ZETIA) 10 MG tablet Take 10 mg by mouth at bedtime.   levothyroxine (SYNTHROID) 88 MCG tablet Take 88 mcg by mouth daily before breakfast.   lisinopril (ZESTRIL) 20 MG tablet Take  1 tablet by mouth daily.   METFORMIN HCL PO Take 1,000 mg by mouth in the morning and at bedtime.   naproxen sodium (ANAPROX) 220 MG tablet Take 220 mg by mouth daily as needed. For pain   rosuvastatin (CRESTOR) 20 MG tablet Take 20 mg by mouth at bedtime.   [DISCONTINUED] lisinopril-hydrochlorothiazide  (PRINZIDE,ZESTORETIC) 20-25 MG per tablet Take 1 tablet by mouth every morning.   No facility-administered encounter medications on file as of 07/10/2022.     ONCOLOGIC FAMILY HISTORY:  Family History  Problem Relation Age of Onset   Breast cancer Neg Hx      SOCIAL HISTORY:  Social History   Socioeconomic History   Marital status: Married    Spouse name: Not on file   Number of children: Not on file   Years of education: Not on file   Highest education level: Not on file  Occupational History   Not on file  Tobacco Use   Smoking status: Never   Smokeless tobacco: Never  Substance and Sexual Activity   Alcohol use: Not Currently   Drug use: Not on file   Sexual activity: Not on file  Other Topics Concern   Not on file  Social History Narrative   Not on file   Social Determinants of Health   Financial Resource Strain: Not on file  Food Insecurity: Not on file  Transportation Needs: Not on file  Physical Activity: Not on file  Stress: Not on file  Social Connections: Not on file  Intimate Partner Violence: Not on file     OBSERVATIONS/OBJECTIVE:  BP (!) 179/57 (BP Location: Right Arm, Patient Position: Sitting)   Pulse (!) 105   Temp 98.7 F (37.1 C) (Tympanic)   Resp 18   Ht 5' 2.5" (1.588 m)   Wt 145 lb (65.8 kg)   SpO2 100%   BMI 26.10 kg/m  GENERAL: Patient is a well appearing female in no acute distress HEENT:  Sclerae anicteric.  Oropharynx clear and moist. No ulcerations or evidence of oropharyngeal candidiasis. Neck is supple.  NODES:  No cervical, supraclavicular, or axillary lymphadenopathy palpated.  BREAST EXAM:  Left breast s/p lumpectomy, no sign of local recurrence, right breast benign LUNGS:  Clear to auscultation bilaterally.  No wheezes or rhonchi. HEART:  Regular rate and rhythm. No murmur appreciated. ABDOMEN:  Soft, nontender.  Positive, normoactive bowel sounds. No organomegaly palpated. MSK:  No focal spinal tenderness to palpation.  Full range of motion bilaterally in the upper extremities. EXTREMITIES:  No peripheral edema.   SKIN:  Clear with no obvious rashes or skin changes. No nail dyscrasia. NEURO:  Nonfocal. Well oriented.  Appropriate affect.  LABORATORY DATA:  None for this visit.  DIAGNOSTIC IMAGING:  None for this visit.      ASSESSMENT AND PLAN:  Ms.. Place is a pleasant 89 y.o. female with Stage IA left breast invasive ductal carcinoma, ER+/PR+/HER2-, diagnosed in 12/2021, treated with lumpectomy and anti-estrogen therapy with Anastrozole beginning in 01/2022.  She presents to the Survivorship Clinic for our initial meeting and routine follow-up post-completion of treatment for breast cancer.    1. Stage IA left breast cancer:  Ms. Callaway is continuing to recover from definitive treatment for breast cancer. She will follow-up with her medical oncologist, Dr. Pamelia Hoit in 6 months with history and physical exam per surveillance protocol. Her mammogram is due 11/2022; orders placed today. Today, a comprehensive survivorship care plan and treatment summary was reviewed with the patient today  detailing her breast cancer diagnosis, treatment course, potential late/long-term effects of treatment, appropriate follow-up care with recommendations for the future, and patient education resources.  A copy of this summary, along with a letter will be sent to the patient's primary care provider via mail/fax/In Basket message after today's visit.    2. Bone health:  Given Ms. Mccartt's age/history of breast cancer, she is at risk for bone demineralization.  She is undergoing annual bone density testing with her endocrinologist and will continue to follow their guidance on bone health testing and management.    3. Cancer screening:  Due to Ms. Guadiana's history and her age, she should receive screening for skin cancers.  The information and recommendations are listed on the patient's comprehensive care plan/treatment  summary and were reviewed in detail with the patient.    4. Health maintenance and wellness promotion: Ms. Grenda was encouraged to consume 5-7 servings of fruits and vegetables per day. We reviewed the "Nutrition Rainbow" handout.  She was also encouraged to engage in moderate to vigorous exercise for 30 minutes per day most days of the week.  She was instructed to limit her alcohol consumption and continue to abstain from tobacco use.     5. Support services/counseling: It is not uncommon for this period of the patient's cancer care trajectory to be one of many emotions and stressors.  She was given information regarding our available services and encouraged to contact me with any questions or for help enrolling in any of our support group/programs.    Follow up instructions:    -Return to cancer center in 6 months for f/u with Dr. Pamelia Hoit  -Mammogram due in 12/04/2022 -She is welcome to return back to the Survivorship Clinic at any time; no additional follow-up needed at this time.  -Consider referral back to survivorship as a long-term survivor for continued surveillance  The patient was provided an opportunity to ask questions and all were answered. The patient agreed with the plan and demonstrated an understanding of the instructions.   Total encounter time:30 minutes*in face-to-face visit time, chart review, lab review, care coordination, order entry, and documentation of the encounter time.  Lillard Anes, NP 07/11/22 11:16 AM Medical Oncology and Hematology [facility] [address] [place], [place] [address] Tel. [phone]    Fax. [phone]  *Total Encounter Time as defined by the Centers for Medicare and Medicaid Services includes, in addition to the face-to-face time of a patient visit (documented in the note above) non-face-to-face time: obtaining and reviewing outside history, ordering and reviewing medications, tests or procedures, care coordination  (communications with other health care professionals or caregivers) and documentation in the medical record.

## 2022-07-31 DIAGNOSIS — M8580 Other specified disorders of bone density and structure, unspecified site: Secondary | ICD-10-CM | POA: Diagnosis not present

## 2022-07-31 DIAGNOSIS — E039 Hypothyroidism, unspecified: Secondary | ICD-10-CM | POA: Diagnosis not present

## 2022-07-31 DIAGNOSIS — M810 Age-related osteoporosis without current pathological fracture: Secondary | ICD-10-CM | POA: Diagnosis not present

## 2022-07-31 DIAGNOSIS — E213 Hyperparathyroidism, unspecified: Secondary | ICD-10-CM | POA: Diagnosis not present

## 2022-08-07 DIAGNOSIS — M2550 Pain in unspecified joint: Secondary | ICD-10-CM | POA: Diagnosis not present

## 2022-08-07 DIAGNOSIS — R790 Abnormal level of blood mineral: Secondary | ICD-10-CM | POA: Diagnosis not present

## 2022-08-07 DIAGNOSIS — E213 Hyperparathyroidism, unspecified: Secondary | ICD-10-CM | POA: Diagnosis not present

## 2022-08-07 DIAGNOSIS — M810 Age-related osteoporosis without current pathological fracture: Secondary | ICD-10-CM | POA: Diagnosis not present

## 2022-08-07 DIAGNOSIS — E039 Hypothyroidism, unspecified: Secondary | ICD-10-CM | POA: Diagnosis not present

## 2022-08-07 DIAGNOSIS — M791 Myalgia, unspecified site: Secondary | ICD-10-CM | POA: Diagnosis not present

## 2022-08-08 ENCOUNTER — Ambulatory Visit
Admission: RE | Admit: 2022-08-08 | Discharge: 2022-08-08 | Disposition: A | Discharge status: Home / Self Care | Payer: Medicare Other | Source: Ambulatory Visit | Attending: Internal Medicine | Admitting: Internal Medicine

## 2022-08-08 DIAGNOSIS — M8580 Other specified disorders of bone density and structure, unspecified site: Secondary | ICD-10-CM

## 2022-08-08 DIAGNOSIS — E213 Hyperparathyroidism, unspecified: Secondary | ICD-10-CM

## 2022-08-08 DIAGNOSIS — E349 Endocrine disorder, unspecified: Secondary | ICD-10-CM | POA: Diagnosis not present

## 2022-08-08 DIAGNOSIS — M8588 Other specified disorders of bone density and structure, other site: Secondary | ICD-10-CM | POA: Diagnosis not present

## 2022-08-15 DIAGNOSIS — M2550 Pain in unspecified joint: Secondary | ICD-10-CM | POA: Diagnosis not present

## 2022-08-15 DIAGNOSIS — R7989 Other specified abnormal findings of blood chemistry: Secondary | ICD-10-CM | POA: Diagnosis not present

## 2022-08-15 DIAGNOSIS — R790 Abnormal level of blood mineral: Secondary | ICD-10-CM | POA: Diagnosis not present

## 2022-08-15 DIAGNOSIS — E213 Hyperparathyroidism, unspecified: Secondary | ICD-10-CM | POA: Diagnosis not present

## 2022-08-22 DIAGNOSIS — M2550 Pain in unspecified joint: Secondary | ICD-10-CM | POA: Diagnosis not present

## 2022-08-22 DIAGNOSIS — M791 Myalgia, unspecified site: Secondary | ICD-10-CM | POA: Diagnosis not present

## 2022-09-05 DIAGNOSIS — H35372 Puckering of macula, left eye: Secondary | ICD-10-CM | POA: Diagnosis not present

## 2022-09-05 DIAGNOSIS — E119 Type 2 diabetes mellitus without complications: Secondary | ICD-10-CM | POA: Diagnosis not present

## 2022-09-05 DIAGNOSIS — H35033 Hypertensive retinopathy, bilateral: Secondary | ICD-10-CM | POA: Diagnosis not present

## 2022-09-05 DIAGNOSIS — H35363 Drusen (degenerative) of macula, bilateral: Secondary | ICD-10-CM | POA: Diagnosis not present

## 2022-09-05 DIAGNOSIS — H524 Presbyopia: Secondary | ICD-10-CM | POA: Diagnosis not present

## 2022-09-05 DIAGNOSIS — H26492 Other secondary cataract, left eye: Secondary | ICD-10-CM | POA: Diagnosis not present

## 2022-09-18 DIAGNOSIS — M810 Age-related osteoporosis without current pathological fracture: Secondary | ICD-10-CM | POA: Diagnosis not present

## 2022-09-18 DIAGNOSIS — R790 Abnormal level of blood mineral: Secondary | ICD-10-CM | POA: Diagnosis not present

## 2022-09-18 DIAGNOSIS — E213 Hyperparathyroidism, unspecified: Secondary | ICD-10-CM | POA: Diagnosis not present

## 2022-09-18 DIAGNOSIS — E039 Hypothyroidism, unspecified: Secondary | ICD-10-CM | POA: Diagnosis not present

## 2022-09-20 DIAGNOSIS — M79603 Pain in arm, unspecified: Secondary | ICD-10-CM | POA: Diagnosis not present

## 2022-09-20 DIAGNOSIS — E1169 Type 2 diabetes mellitus with other specified complication: Secondary | ICD-10-CM | POA: Diagnosis not present

## 2022-09-20 DIAGNOSIS — E119 Type 2 diabetes mellitus without complications: Secondary | ICD-10-CM | POA: Diagnosis not present

## 2022-09-20 DIAGNOSIS — E213 Hyperparathyroidism, unspecified: Secondary | ICD-10-CM | POA: Diagnosis not present

## 2022-10-02 DIAGNOSIS — E213 Hyperparathyroidism, unspecified: Secondary | ICD-10-CM | POA: Diagnosis not present

## 2022-10-02 DIAGNOSIS — M79603 Pain in arm, unspecified: Secondary | ICD-10-CM | POA: Diagnosis not present

## 2022-10-02 DIAGNOSIS — M79646 Pain in unspecified finger(s): Secondary | ICD-10-CM | POA: Diagnosis not present

## 2022-10-02 DIAGNOSIS — M545 Low back pain, unspecified: Secondary | ICD-10-CM | POA: Diagnosis not present

## 2022-10-02 DIAGNOSIS — M533 Sacrococcygeal disorders, not elsewhere classified: Secondary | ICD-10-CM | POA: Diagnosis not present

## 2022-10-02 DIAGNOSIS — R700 Elevated erythrocyte sedimentation rate: Secondary | ICD-10-CM | POA: Diagnosis not present

## 2022-10-02 DIAGNOSIS — M1990 Unspecified osteoarthritis, unspecified site: Secondary | ICD-10-CM | POA: Diagnosis not present

## 2022-10-02 DIAGNOSIS — M8580 Other specified disorders of bone density and structure, unspecified site: Secondary | ICD-10-CM | POA: Diagnosis not present

## 2022-10-02 DIAGNOSIS — M25562 Pain in left knee: Secondary | ICD-10-CM | POA: Diagnosis not present

## 2022-10-02 DIAGNOSIS — M25561 Pain in right knee: Secondary | ICD-10-CM | POA: Diagnosis not present

## 2022-10-02 DIAGNOSIS — M25511 Pain in right shoulder: Secondary | ICD-10-CM | POA: Diagnosis not present

## 2022-10-02 DIAGNOSIS — M353 Polymyalgia rheumatica: Secondary | ICD-10-CM | POA: Diagnosis not present

## 2022-10-02 DIAGNOSIS — M2550 Pain in unspecified joint: Secondary | ICD-10-CM | POA: Diagnosis not present

## 2022-10-16 DIAGNOSIS — Z9989 Dependence on other enabling machines and devices: Secondary | ICD-10-CM | POA: Diagnosis not present

## 2022-10-16 DIAGNOSIS — E039 Hypothyroidism, unspecified: Secondary | ICD-10-CM | POA: Diagnosis not present

## 2022-10-16 DIAGNOSIS — E1169 Type 2 diabetes mellitus with other specified complication: Secondary | ICD-10-CM | POA: Diagnosis not present

## 2022-10-16 DIAGNOSIS — E119 Type 2 diabetes mellitus without complications: Secondary | ICD-10-CM | POA: Diagnosis not present

## 2022-10-16 DIAGNOSIS — R790 Abnormal level of blood mineral: Secondary | ICD-10-CM | POA: Diagnosis not present

## 2022-10-16 DIAGNOSIS — I10 Essential (primary) hypertension: Secondary | ICD-10-CM | POA: Diagnosis not present

## 2022-10-16 DIAGNOSIS — M353 Polymyalgia rheumatica: Secondary | ICD-10-CM | POA: Diagnosis not present

## 2022-10-19 IMAGING — MG MM DIGITAL SCREENING BILAT W/ TOMO AND CAD
6 of 12 series · 6 of 36 positions shown · non-contrast
Comparison: Previous exam(s).

CLINICAL DATA: Screening.

EXAM:
DIGITAL SCREENING BILATERAL MAMMOGRAM WITH TOMOSYNTHESIS AND CAD
TECHNIQUE: Bilateral screening digital craniocaudal and mediolateral oblique
mammograms were obtained. Bilateral screening digital breast
tomosynthesis was performed. The images were evaluated with
computer-aided detection.

[L CC synth-2D (1 of 2)]
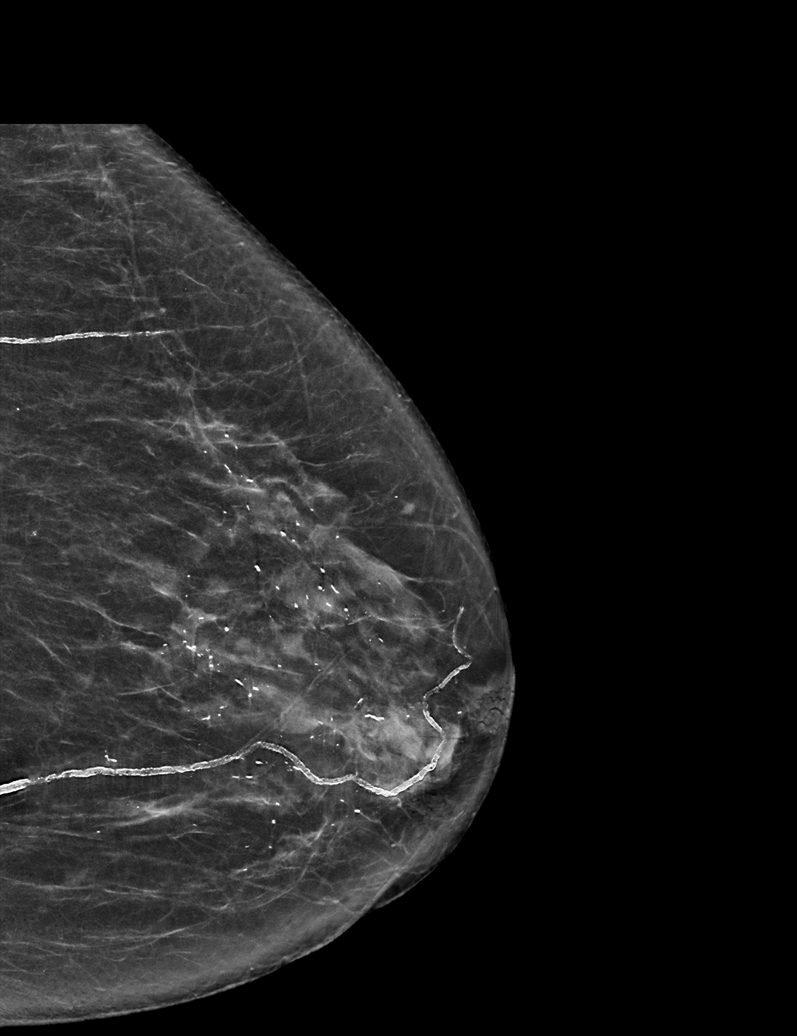

[L MLO synth-2D (1 of 2)]
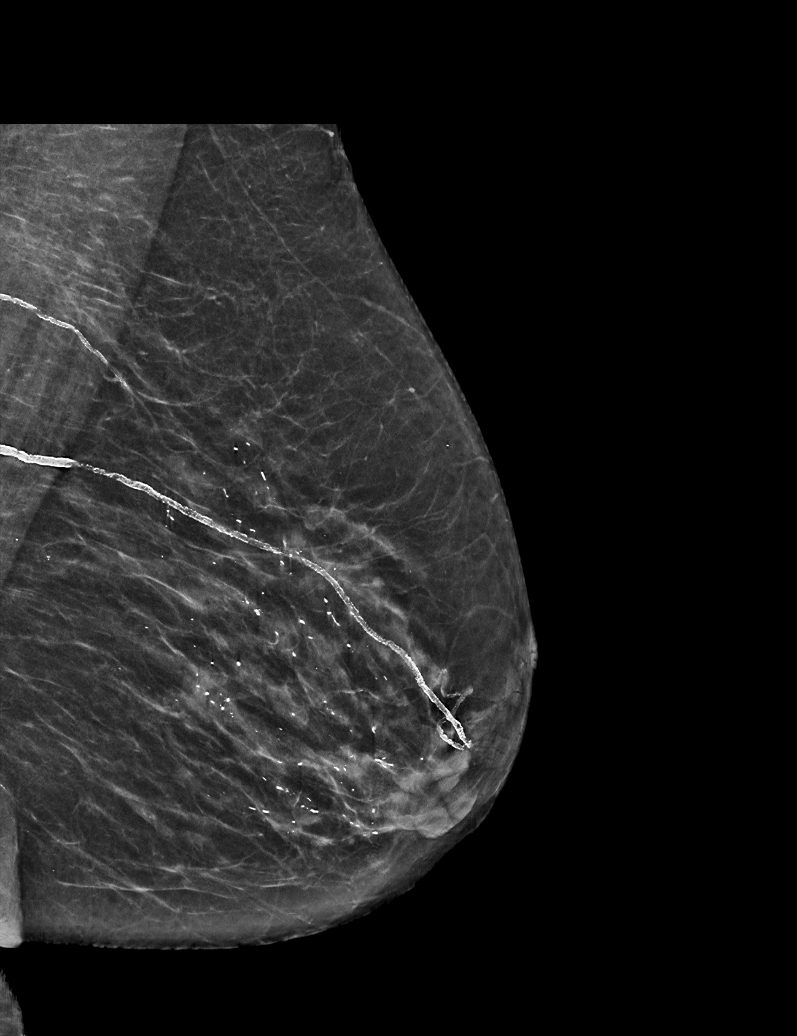

[L CC synth-2D (2 of 2)]
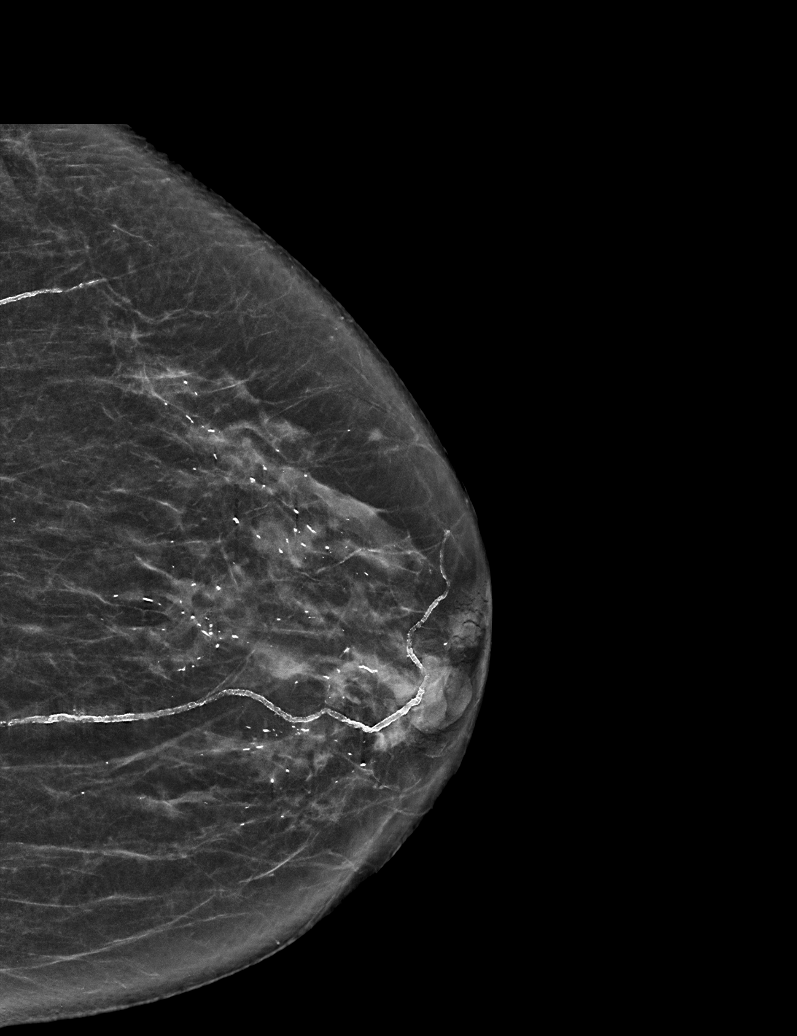

[L MLO synth-2D (2 of 2)]
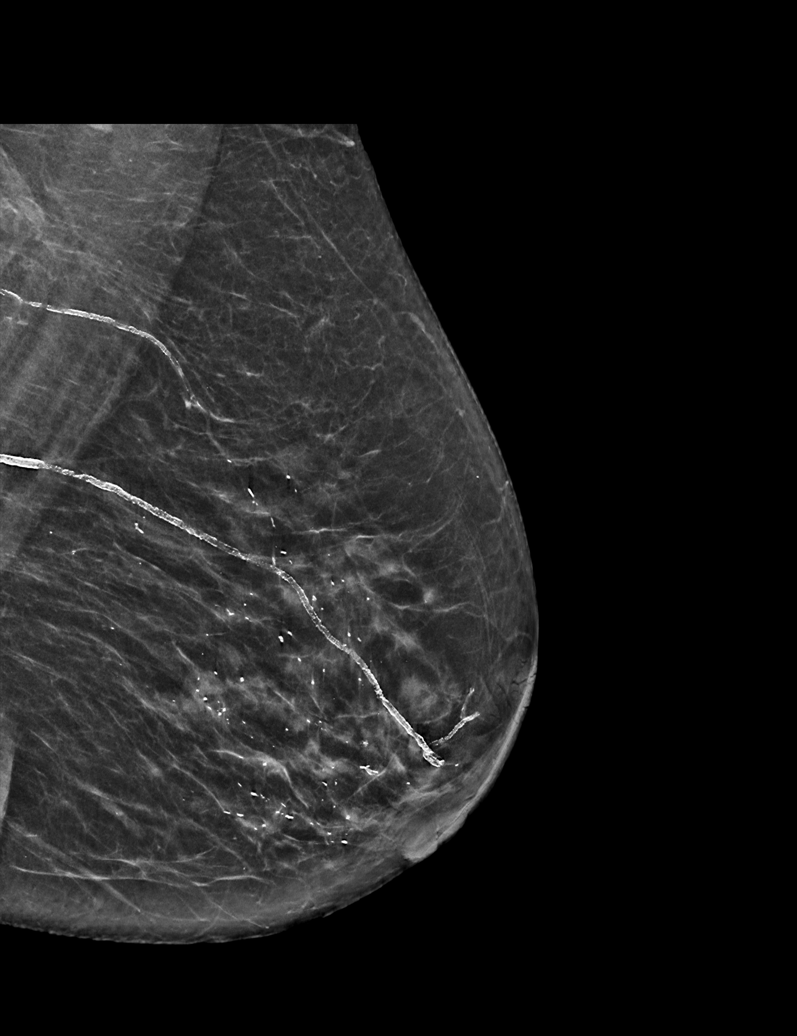

[R MLO synth-2D]
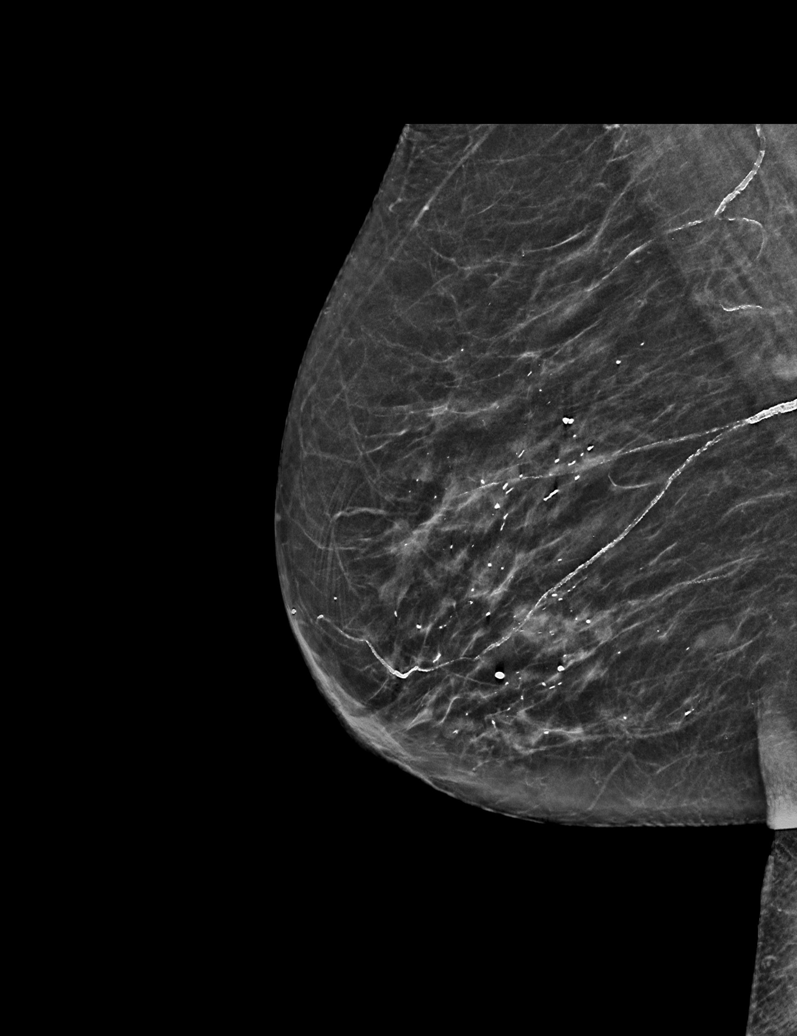

[R CC synth-2D]
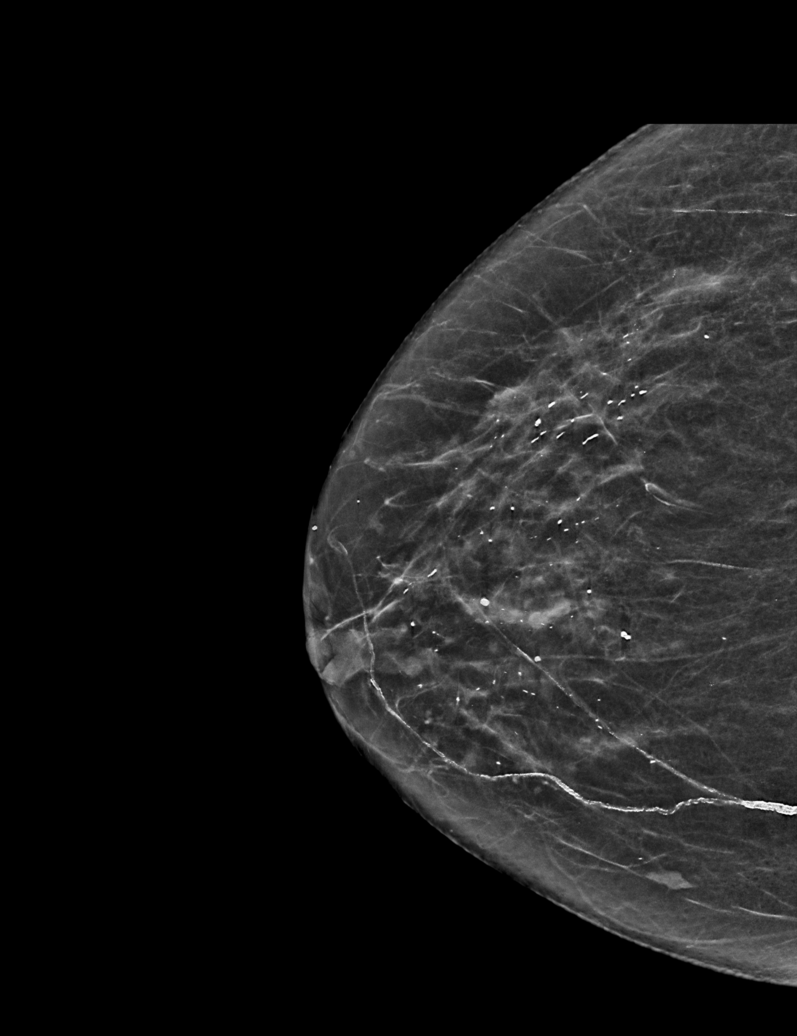

[6 of 36 positions shown; findings below may reference images not displayed]

ACR Breast Density Category b: There are scattered areas of
fibroglandular density.
FINDINGS: There are no findings suspicious for malignancy.
IMPRESSION: No mammographic evidence of malignancy. A result letter of this
screening mammogram will be mailed directly to the patient.

RECOMMENDATION:
Screening mammogram in one year. (Code:51-O-LD2)

BI-RADS CATEGORY  1: Negative.

## 2022-11-06 ENCOUNTER — Ambulatory Visit
Admission: RE | Admit: 2022-11-06 | Discharge: 2022-11-06 | Disposition: A | Discharge status: Home / Self Care | Payer: Medicare Other | Source: Ambulatory Visit | Attending: Adult Health | Admitting: Adult Health

## 2022-11-06 DIAGNOSIS — Z853 Personal history of malignant neoplasm of breast: Secondary | ICD-10-CM | POA: Diagnosis not present

## 2022-11-06 DIAGNOSIS — C50512 Malignant neoplasm of lower-outer quadrant of left female breast: Secondary | ICD-10-CM

## 2022-11-12 DIAGNOSIS — Z7952 Long term (current) use of systemic steroids: Secondary | ICD-10-CM | POA: Diagnosis not present

## 2022-11-12 DIAGNOSIS — M353 Polymyalgia rheumatica: Secondary | ICD-10-CM | POA: Diagnosis not present

## 2022-11-12 DIAGNOSIS — M1990 Unspecified osteoarthritis, unspecified site: Secondary | ICD-10-CM | POA: Diagnosis not present

## 2022-11-12 DIAGNOSIS — M8580 Other specified disorders of bone density and structure, unspecified site: Secondary | ICD-10-CM | POA: Diagnosis not present

## 2022-11-12 DIAGNOSIS — E213 Hyperparathyroidism, unspecified: Secondary | ICD-10-CM | POA: Diagnosis not present

## 2022-12-05 DIAGNOSIS — Z7952 Long term (current) use of systemic steroids: Secondary | ICD-10-CM | POA: Diagnosis not present

## 2022-12-05 DIAGNOSIS — M8580 Other specified disorders of bone density and structure, unspecified site: Secondary | ICD-10-CM | POA: Diagnosis not present

## 2022-12-05 DIAGNOSIS — R21 Rash and other nonspecific skin eruption: Secondary | ICD-10-CM | POA: Diagnosis not present

## 2022-12-05 DIAGNOSIS — E213 Hyperparathyroidism, unspecified: Secondary | ICD-10-CM | POA: Diagnosis not present

## 2022-12-05 DIAGNOSIS — M1990 Unspecified osteoarthritis, unspecified site: Secondary | ICD-10-CM | POA: Diagnosis not present

## 2022-12-05 DIAGNOSIS — M353 Polymyalgia rheumatica: Secondary | ICD-10-CM | POA: Diagnosis not present

## 2022-12-17 DIAGNOSIS — B353 Tinea pedis: Secondary | ICD-10-CM | POA: Diagnosis not present

## 2023-01-10 ENCOUNTER — Inpatient Hospital Stay: Payer: Medicare Other | Attending: Hematology and Oncology | Admitting: Hematology and Oncology

## 2023-01-10 VITALS — BP 177/65 | HR 95 | Temp 97.8°F | Resp 18 | Ht 62.5 in | Wt 145.3 lb

## 2023-01-10 DIAGNOSIS — Z170 Estrogen receptor positive status [ER+]: Secondary | ICD-10-CM

## 2023-01-10 DIAGNOSIS — M6281 Muscle weakness (generalized): Secondary | ICD-10-CM | POA: Diagnosis not present

## 2023-01-10 DIAGNOSIS — Z7982 Long term (current) use of aspirin: Secondary | ICD-10-CM | POA: Diagnosis not present

## 2023-01-10 DIAGNOSIS — R600 Localized edema: Secondary | ICD-10-CM | POA: Insufficient documentation

## 2023-01-10 DIAGNOSIS — Z853 Personal history of malignant neoplasm of breast: Secondary | ICD-10-CM | POA: Diagnosis not present

## 2023-01-10 DIAGNOSIS — C50512 Malignant neoplasm of lower-outer quadrant of left female breast: Secondary | ICD-10-CM | POA: Diagnosis not present

## 2023-01-10 DIAGNOSIS — Z7952 Long term (current) use of systemic steroids: Secondary | ICD-10-CM | POA: Insufficient documentation

## 2023-01-10 DIAGNOSIS — M810 Age-related osteoporosis without current pathological fracture: Secondary | ICD-10-CM | POA: Insufficient documentation

## 2023-01-10 DIAGNOSIS — Z79899 Other long term (current) drug therapy: Secondary | ICD-10-CM | POA: Diagnosis not present

## 2023-01-10 NOTE — Progress Notes (Signed)
Patient Care Team: Pa, [facility] as PCP - General (Family Medicine) Serena Croissant, MD as Consulting Physician (Hematology and Oncology) Emelia Loron, MD as Consulting Physician (General Surgery)  DIAGNOSIS:  Encounter Diagnosis  Name Primary?   Malignant neoplasm of lower-outer quadrant of left breast of female, estrogen receptor positive (HCC) Yes    SUMMARY OF ONCOLOGIC HISTORY: Oncology History  Malignant neoplasm of lower-outer quadrant of left breast of female, estrogen receptor positive (HCC)  12/01/2021 Initial Diagnosis   Screening mammogram detected left breast mass 1.1 cm at 4 o'clock position: Biopsy: Grade 2 IDC with lobular features, ER 95%, PR 40%, Ki-67 10%, HER2 2+ by IHC and FISH negative ratio 1   12/20/2021 Cancer Staging   Staging form: Breast, AJCC 8th Edition - Clinical: Stage IA (cT1c, cN0, cM0, G2, ER+, PR+, HER2-) - Signed by Serena Croissant, MD on 12/20/2021 Stage prefix: Initial diagnosis Histologic grading system: 3 grade system   12/25/2021 Surgery   Left Lumpectomy: Grade 1 IDC with focal DCIS 0.9 cm, Margins Neg, ER 95%, PR 40%, Her 2 Neg, KI 67: 10%    01/2022 -  Anti-estrogen oral therapy   Anastrozole--opted to forego     CHIEF COMPLIANT: Surveillance of breast cancer   History of Present Illness   The patient, a 89 year old woman with H/O breast cancer, presents with muscle problems in her arms. She reports that these issues have been ongoing since July. Despite being put on a regimen of magnesium and prednisone, the problems persisted. The patient was then referred to a specialist who put her on a longer regimen of prednisone, which she is currently on. She is now down to nine pills a day and will continue to decrease the dosage.  The patient also reports having high calcium levels, which she has been trying to treat with cinacalcet due to hyperparathyroidism. She has chosen not to have surgery for this condition.  Her calcium levels are now stable.  The patient has also been diagnosed with osteoporosis following a bone density test in June. She acknowledges this as an expected part of aging and is taking precautions to avoid falls.  The patient also mentions a recent mammogram in September, which came out good. She plans to continue getting mammograms annually.  Lastly, the patient reports some swelling in her legs, which she believes may be due to a fungus on one ankle and the medication she is taking for it.         ALLERGIES:  has No Known Allergies.  MEDICATIONS:  Current Outpatient Medications  Medication Sig Dispense Refill   aspirin EC 81 MG tablet Take 81 mg by mouth at bedtime.     Cholecalciferol 50 MCG (2000 UT) CAPS Take 2,000 Units by mouth in the morning and at bedtime.     cinacalcet (SENSIPAR) 30 MG tablet Take 30 mg by mouth daily.     cyanocobalamin (VITAMIN B12) 1000 MCG tablet Take 1,000 mcg by mouth daily.     ezetimibe (ZETIA) 10 MG tablet Take 10 mg by mouth at bedtime.     levothyroxine (SYNTHROID) 88 MCG tablet Take 88 mcg by mouth daily before breakfast.     lisinopril (ZESTRIL) 20 MG tablet Take 1 tablet by mouth daily.     METFORMIN HCL PO Take 1,000 mg by mouth in the morning and at bedtime.     naproxen sodium (ANAPROX) 220 MG tablet Take 220 mg by mouth daily as needed. For pain  rosuvastatin (CRESTOR) 20 MG tablet Take 20 mg by mouth at bedtime.     No current facility-administered medications for this visit.    PHYSICAL EXAMINATION: ECOG PERFORMANCE STATUS: 1 - Symptomatic but completely ambulatory  Vitals:   01/10/23 1443  BP: (!) 177/65  Pulse: 95  Resp: 18  Temp: 97.8 F (36.6 C)  SpO2: 98%   Filed Weights   01/10/23 1443  Weight: 145 lb 4.8 oz (65.9 kg)    Physical Exam   EXTREMITIES:   pedal edema.    LABORATORY DATA:  I have reviewed the data as listed    Latest Ref Rng & Units 12/20/2021    2:03 PM 09/15/2012    5:00 AM 09/14/2012     5:05 AM  CMP  Glucose 70 - 99 mg/dL 518  841  660   BUN 8 - 23 mg/dL 17  14  17    Creatinine 0.44 - 1.00 mg/dL 6.30  1.60  1.09   Sodium 135 - 145 mmol/L 141  144  141   Potassium 3.5 - 5.1 mmol/L 4.3  4.1  4.1   Chloride 98 - 111 mmol/L 103  112  113   CO2 22 - 32 mmol/L 25  26  24    Calcium 8.9 - 10.3 mg/dL 32.3  9.6  9.4     Lab Results  Component Value Date   WBC 7.8 09/13/2012   HGB 11.2 (L) 09/13/2012   HCT 33.5 (L) 09/13/2012   MCV 97.4 09/13/2012   PLT 188 09/13/2012   NEUTROABS 5.0 09/11/2012    ASSESSMENT & PLAN:  Malignant neoplasm of lower-outer quadrant of left breast of female, estrogen receptor positive (HCC) 12/01/2021:Screening mammogram detected left breast mass 1.1 cm at 4 o'clock position: Biopsy: Grade 2 IDC with lobular features, ER 95%, PR 40%, Ki-67 10%, HER2 2+ by IHC and FISH negative ratio 1    12/25/21: Left Lumpectomy: Grade 1 IDC with focal DCIS 0.9 cm, Margins Neg, ER 95%, PR 40%, Her 2 Neg, KI 67: 10% Treatment plan: Decided not to initiate antiestrogen therapy based on risks and benefits.  Breast cancer surveillance: Breast exam 01/10/2023: Benign Mammogram 11/06/2022: Benign breast density category B Bone density 08/08/2022: T-score -2.6: Osteoporosis: Recommended bisphosphonate therapy along with calcium and vitamin D and weightbearing exercise ------------------------------------- Assessment and Plan    Muscle Weakness On long-term prednisone regimen since July for muscle weakness in arms from PMR. Currently on 9mg  daily with plan to taper. No significant side effects noted. -Continue current prednisone regimen as directed by Dr. Tito Dine.  Hypercalcemia Stable with treatment using cinacalcet. Patient declined surgical intervention for hyperparathyroidism. -Continue cinacalcet as directed.  Osteoporosis Noted on recent bone density scan. Patient aware and understands risk of falls. -Continue current management and fall precautions.  Lower  Extremity Edema Recent onset, possibly related to dermatologic medication. No intervention planned at this time. -Consider use of compression socks, particularly in the morning.  General Health Maintenance Mammogram in September was normal. Patient agrees to continue annual mammograms. -Plan for follow-up in one year for another mammogram. -Consider transition to primary care for future follow-ups after next year's visit.        No orders of the defined types were placed in this encounter.  The patient has a good understanding of the overall plan. she agrees with it. she will call with any problems that may develop before the next visit here. Total time spent: 30 mins including face to face time and time  spent for planning, charting and co-ordination of care   Tamsen Meek, MD 01/10/23

## 2023-01-10 NOTE — Assessment & Plan Note (Signed)
12/01/2021:Screening mammogram detected left breast mass 1.1 cm at 4 o'clock position: Biopsy: Grade 2 IDC with lobular features, ER 95%, PR 40%, Ki-67 10%, HER2 2+ by IHC and FISH negative ratio 1    12/25/21: Left Lumpectomy: Grade 1 IDC with focal DCIS 0.9 cm, Margins Neg, ER 95%, PR 40%, Her 2 Neg, KI 67: 10% Treatment plan: Decided not to initiate antiestrogen therapy based on risks and benefits.  Breast cancer surveillance: Breast exam 01/10/2023: Benign Mammogram 11/06/2022: Benign breast density category B Bone density 08/08/2022: T-score -2.6: Osteoporosis: Recommended bisphosphonate therapy along with calcium and vitamin D and weightbearing exercise

## 2023-02-11 DIAGNOSIS — R21 Rash and other nonspecific skin eruption: Secondary | ICD-10-CM | POA: Diagnosis not present

## 2023-02-11 DIAGNOSIS — M8580 Other specified disorders of bone density and structure, unspecified site: Secondary | ICD-10-CM | POA: Diagnosis not present

## 2023-02-11 DIAGNOSIS — Z7952 Long term (current) use of systemic steroids: Secondary | ICD-10-CM | POA: Diagnosis not present

## 2023-02-11 DIAGNOSIS — M1990 Unspecified osteoarthritis, unspecified site: Secondary | ICD-10-CM | POA: Diagnosis not present

## 2023-02-11 DIAGNOSIS — M353 Polymyalgia rheumatica: Secondary | ICD-10-CM | POA: Diagnosis not present

## 2023-02-11 DIAGNOSIS — M7989 Other specified soft tissue disorders: Secondary | ICD-10-CM | POA: Diagnosis not present

## 2023-02-11 DIAGNOSIS — E213 Hyperparathyroidism, unspecified: Secondary | ICD-10-CM | POA: Diagnosis not present

## 2023-02-12 DIAGNOSIS — E785 Hyperlipidemia, unspecified: Secondary | ICD-10-CM | POA: Diagnosis not present

## 2023-02-12 DIAGNOSIS — I10 Essential (primary) hypertension: Secondary | ICD-10-CM | POA: Diagnosis not present

## 2023-02-12 DIAGNOSIS — Z0000 Encounter for general adult medical examination without abnormal findings: Secondary | ICD-10-CM | POA: Diagnosis not present

## 2023-02-12 DIAGNOSIS — C50912 Malignant neoplasm of unspecified site of left female breast: Secondary | ICD-10-CM | POA: Diagnosis not present

## 2023-02-12 DIAGNOSIS — M8588 Other specified disorders of bone density and structure, other site: Secondary | ICD-10-CM | POA: Diagnosis not present

## 2023-02-12 DIAGNOSIS — E1169 Type 2 diabetes mellitus with other specified complication: Secondary | ICD-10-CM | POA: Diagnosis not present

## 2023-02-12 DIAGNOSIS — E039 Hypothyroidism, unspecified: Secondary | ICD-10-CM | POA: Diagnosis not present

## 2023-02-12 DIAGNOSIS — Z23 Encounter for immunization: Secondary | ICD-10-CM | POA: Diagnosis not present

## 2023-02-12 DIAGNOSIS — E1122 Type 2 diabetes mellitus with diabetic chronic kidney disease: Secondary | ICD-10-CM | POA: Diagnosis not present

## 2023-02-12 DIAGNOSIS — M353 Polymyalgia rheumatica: Secondary | ICD-10-CM | POA: Diagnosis not present

## 2023-02-12 DIAGNOSIS — N1831 Chronic kidney disease, stage 3a: Secondary | ICD-10-CM | POA: Diagnosis not present

## 2023-03-08 ENCOUNTER — Telehealth: Payer: Self-pay | Admitting: Hematology and Oncology

## 2023-03-08 DIAGNOSIS — Z170 Estrogen receptor positive status [ER+]: Secondary | ICD-10-CM | POA: Diagnosis not present

## 2023-03-08 DIAGNOSIS — C50512 Malignant neoplasm of lower-outer quadrant of left female breast: Secondary | ICD-10-CM | POA: Diagnosis not present

## 2023-03-08 NOTE — Telephone Encounter (Signed)
 Rhonda Singh

## 2023-05-13 DIAGNOSIS — M8580 Other specified disorders of bone density and structure, unspecified site: Secondary | ICD-10-CM | POA: Diagnosis not present

## 2023-05-13 DIAGNOSIS — M353 Polymyalgia rheumatica: Secondary | ICD-10-CM | POA: Diagnosis not present

## 2023-05-13 DIAGNOSIS — E213 Hyperparathyroidism, unspecified: Secondary | ICD-10-CM | POA: Diagnosis not present

## 2023-05-13 DIAGNOSIS — Z7952 Long term (current) use of systemic steroids: Secondary | ICD-10-CM | POA: Diagnosis not present

## 2023-05-13 DIAGNOSIS — M1990 Unspecified osteoarthritis, unspecified site: Secondary | ICD-10-CM | POA: Diagnosis not present

## 2023-06-12 DIAGNOSIS — N1831 Chronic kidney disease, stage 3a: Secondary | ICD-10-CM | POA: Diagnosis not present

## 2023-06-12 DIAGNOSIS — I10 Essential (primary) hypertension: Secondary | ICD-10-CM | POA: Diagnosis not present

## 2023-06-12 DIAGNOSIS — E039 Hypothyroidism, unspecified: Secondary | ICD-10-CM | POA: Diagnosis not present

## 2023-06-12 DIAGNOSIS — E1169 Type 2 diabetes mellitus with other specified complication: Secondary | ICD-10-CM | POA: Diagnosis not present

## 2023-06-12 DIAGNOSIS — M353 Polymyalgia rheumatica: Secondary | ICD-10-CM | POA: Diagnosis not present

## 2023-06-12 DIAGNOSIS — R790 Abnormal level of blood mineral: Secondary | ICD-10-CM | POA: Diagnosis not present

## 2023-07-30 DIAGNOSIS — E213 Hyperparathyroidism, unspecified: Secondary | ICD-10-CM | POA: Diagnosis not present

## 2023-07-30 DIAGNOSIS — R7989 Other specified abnormal findings of blood chemistry: Secondary | ICD-10-CM | POA: Diagnosis not present

## 2023-07-30 DIAGNOSIS — R790 Abnormal level of blood mineral: Secondary | ICD-10-CM | POA: Diagnosis not present

## 2023-07-30 DIAGNOSIS — E039 Hypothyroidism, unspecified: Secondary | ICD-10-CM | POA: Diagnosis not present

## 2023-07-30 DIAGNOSIS — M810 Age-related osteoporosis without current pathological fracture: Secondary | ICD-10-CM | POA: Diagnosis not present

## 2023-08-05 DIAGNOSIS — E213 Hyperparathyroidism, unspecified: Secondary | ICD-10-CM | POA: Diagnosis not present

## 2023-08-05 DIAGNOSIS — M810 Age-related osteoporosis without current pathological fracture: Secondary | ICD-10-CM | POA: Diagnosis not present

## 2023-08-13 DIAGNOSIS — Z7952 Long term (current) use of systemic steroids: Secondary | ICD-10-CM | POA: Diagnosis not present

## 2023-08-13 DIAGNOSIS — M353 Polymyalgia rheumatica: Secondary | ICD-10-CM | POA: Diagnosis not present

## 2023-08-13 DIAGNOSIS — R748 Abnormal levels of other serum enzymes: Secondary | ICD-10-CM | POA: Diagnosis not present

## 2023-08-13 DIAGNOSIS — E213 Hyperparathyroidism, unspecified: Secondary | ICD-10-CM | POA: Diagnosis not present

## 2023-08-13 DIAGNOSIS — M1990 Unspecified osteoarthritis, unspecified site: Secondary | ICD-10-CM | POA: Diagnosis not present

## 2023-08-13 DIAGNOSIS — M8580 Other specified disorders of bone density and structure, unspecified site: Secondary | ICD-10-CM | POA: Diagnosis not present

## 2023-09-08 NOTE — Assessment & Plan Note (Signed)
 12/01/2021:Screening mammogram detected left breast mass 1.1 cm at 4 o'clock position: Biopsy: Grade 2 IDC with lobular features, ER 95%, PR 40%, Ki-67 10%, HER2 2+ by IHC and FISH negative ratio 1    12/25/21: Left Lumpectomy: Grade 1 IDC with focal DCIS 0.9 cm, Margins Neg, ER 95%, PR 40%, Her 2 Neg, KI 67: 10% Treatment plan: Decided not to initiate antiestrogen therapy based on risks and benefits.   Breast cancer surveillance: Breast exam 09/09/23 Benign Mammogram 11/06/2022: Benign breast density category B Bone density 08/08/2022: T-score -2.6: Osteoporosis: Recommended bisphosphonate therapy along with calcium  and vitamin D and weightbearing exercise

## 2023-09-09 ENCOUNTER — Inpatient Hospital Stay: Payer: Medicare Other | Attending: Hematology and Oncology | Admitting: Hematology and Oncology

## 2023-09-09 VITALS — BP 147/61 | HR 92 | Temp 98.3°F | Resp 17 | Ht 62.0 in | Wt 145.8 lb

## 2023-09-09 DIAGNOSIS — Z1731 Human epidermal growth factor receptor 2 positive status: Secondary | ICD-10-CM | POA: Insufficient documentation

## 2023-09-09 DIAGNOSIS — Z79811 Long term (current) use of aromatase inhibitors: Secondary | ICD-10-CM | POA: Diagnosis not present

## 2023-09-09 DIAGNOSIS — Z170 Estrogen receptor positive status [ER+]: Secondary | ICD-10-CM | POA: Insufficient documentation

## 2023-09-09 DIAGNOSIS — E538 Deficiency of other specified B group vitamins: Secondary | ICD-10-CM | POA: Insufficient documentation

## 2023-09-09 DIAGNOSIS — Z79899 Other long term (current) drug therapy: Secondary | ICD-10-CM | POA: Insufficient documentation

## 2023-09-09 DIAGNOSIS — Z1721 Progesterone receptor positive status: Secondary | ICD-10-CM | POA: Insufficient documentation

## 2023-09-09 DIAGNOSIS — C50512 Malignant neoplasm of lower-outer quadrant of left female breast: Secondary | ICD-10-CM | POA: Diagnosis not present

## 2023-09-09 NOTE — Progress Notes (Signed)
 Patient Care Team: Pa, [facility] as PCP - General (Family Medicine) Odean Potts, MD as Consulting Physician (Hematology and Oncology) Ebbie Cough, MD as Consulting Physician (General Surgery)  DIAGNOSIS:  Encounter Diagnosis  Name Primary?   Malignant neoplasm of lower-outer quadrant of left breast of female, estrogen receptor positive (HCC) Yes    SUMMARY OF ONCOLOGIC HISTORY: Oncology History  Malignant neoplasm of lower-outer quadrant of left breast of female, estrogen receptor positive (HCC)  12/01/2021 Initial Diagnosis   Screening mammogram detected left breast mass 1.1 cm at 4 o'clock position: Biopsy: Grade 2 IDC with lobular features, ER 95%, PR 40%, Ki-67 10%, HER2 2+ by IHC and FISH negative ratio 1   12/20/2021 Cancer Staging   Staging form: Breast, AJCC 8th Edition - Clinical: Stage IA (cT1c, cN0, cM0, G2, ER+, PR+, HER2-) - Signed by Odean Potts, MD on 12/20/2021 Stage prefix: Initial diagnosis Histologic grading system: 3 grade system   12/25/2021 Surgery   Left Lumpectomy: Grade 1 IDC with focal DCIS 0.9 cm, Margins Neg, ER 95%, PR 40%, Her 2 Neg, KI 67: 10%    01/2022 -  Anti-estrogen oral therapy   Anastrozole--opted to forego     CHIEF COMPLIANT: Surveillance of breast cancer  HISTORY OF PRESENT ILLNESS:   History of Present Illness The patient presents for a routine follow-up visit.  She maintains a stable medication regimen, including B12, methotrexate, metformin, and Crestor, with no changes or issues reported. She uses 'protsin' as needed for pain management.     ALLERGIES:  has no known allergies.  MEDICATIONS:  Current Outpatient Medications  Medication Sig Dispense Refill   aspirin  EC 81 MG tablet Take 81 mg by mouth at bedtime.     Cholecalciferol 50 MCG (2000 UT) CAPS Take 2,000 Units by mouth in the morning and at bedtime.     cinacalcet (SENSIPAR) 30 MG tablet Take 30 mg by mouth daily.      cyanocobalamin (VITAMIN B12) 1000 MCG tablet Take 1,000 mcg by mouth daily.     ezetimibe  (ZETIA ) 10 MG tablet Take 10 mg by mouth at bedtime.     levothyroxine  (SYNTHROID ) 88 MCG tablet Take 88 mcg by mouth daily before breakfast.     lisinopril (ZESTRIL) 20 MG tablet Take 1 tablet by mouth daily.     METFORMIN HCL PO Take 1,000 mg by mouth in the morning and at bedtime.     naproxen sodium (ANAPROX) 220 MG tablet Take 220 mg by mouth daily as needed. For pain     rosuvastatin (CRESTOR) 20 MG tablet Take 20 mg by mouth at bedtime.     No current facility-administered medications for this visit.    PHYSICAL EXAMINATION: ECOG PERFORMANCE STATUS: 1 - Symptomatic but completely ambulatory  Vitals:   09/09/23 1039  BP: (!) 147/61  Pulse: 92  Resp: 17  Temp: 98.3 F (36.8 C)  SpO2: 99%   Filed Weights   09/09/23 1039  Weight: 145 lb 12.8 oz (66.1 kg)   Breast exam: Benign  LABORATORY DATA:  I have reviewed the data as listed    Latest Ref Rng & Units 12/20/2021    2:03 PM 09/15/2012    5:00 AM 09/14/2012    5:05 AM  CMP  Glucose 70 - 99 mg/dL 881  890  876   BUN 8 - 23 mg/dL 17  14  17    Creatinine 0.44 - 1.00 mg/dL 8.90  9.17  9.10   Sodium 135 -  145 mmol/L 141  144  141   Potassium 3.5 - 5.1 mmol/L 4.3  4.1  4.1   Chloride 98 - 111 mmol/L 103  112  113   CO2 22 - 32 mmol/L 25  26  24    Calcium  8.9 - 10.3 mg/dL 89.8  9.6  9.4     Lab Results  Component Value Date   WBC 7.8 09/13/2012   HGB 11.2 (L) 09/13/2012   HCT 33.5 (L) 09/13/2012   MCV 97.4 09/13/2012   PLT 188 09/13/2012   NEUTROABS 5.0 09/11/2012    ASSESSMENT & PLAN:  Malignant neoplasm of lower-outer quadrant of left breast of female, estrogen receptor positive (HCC) 12/01/2021:Screening mammogram detected left breast mass 1.1 cm at 4 o'clock position: Biopsy: Grade 2 IDC with lobular features, ER 95%, PR 40%, Ki-67 10%, HER2 2+ by IHC and FISH negative ratio 1    12/25/21: Left Lumpectomy: Grade 1 IDC  with focal DCIS 0.9 cm, Margins Neg, ER 95%, PR 40%, Her 2 Neg, KI 67: 10% Treatment plan: Decided not to initiate antiestrogen therapy based on risks and benefits.   Breast cancer surveillance: Breast exam 09/09/23 Benign Mammogram 11/06/2022: Benign breast density category B Bone density 08/08/2022: T-score -2.6: Osteoporosis ------------------------------------- Assessment and Plan Assessment & Plan Vitamin B12 deficiency Vitamin B12 deficiency is well-managed with the current treatment regimen.  Rheumatoid arthritis Rheumatoid arthritis is well-controlled on methotrexate therapy.  Type 2 diabetes mellitus Type 2 diabetes mellitus is managed with metformin.  Hyperlipidemia Hyperlipidemia is managed with Crestor.  Breast cancer screening Discussed upcoming mammogram due in October or November. She has not decided on proceeding. - Perform breast exam today. - If agreeable, schedule mammogram for fall.      No orders of the defined types were placed in this encounter.  The patient has a good understanding of the overall plan. she agrees with it. she will call with any problems that may develop before the next visit here. Total time spent: 30 mins including face to face time and time spent for planning, charting and co-ordination of care   Viinay K Rose-Marie Hickling, MD 09/09/23

## 2023-09-13 DIAGNOSIS — H26492 Other secondary cataract, left eye: Secondary | ICD-10-CM | POA: Diagnosis not present

## 2023-09-13 DIAGNOSIS — H35372 Puckering of macula, left eye: Secondary | ICD-10-CM | POA: Diagnosis not present

## 2023-09-13 DIAGNOSIS — H35033 Hypertensive retinopathy, bilateral: Secondary | ICD-10-CM | POA: Diagnosis not present

## 2023-09-13 DIAGNOSIS — H04123 Dry eye syndrome of bilateral lacrimal glands: Secondary | ICD-10-CM | POA: Diagnosis not present

## 2023-09-13 DIAGNOSIS — E119 Type 2 diabetes mellitus without complications: Secondary | ICD-10-CM | POA: Diagnosis not present

## 2023-10-16 DIAGNOSIS — E039 Hypothyroidism, unspecified: Secondary | ICD-10-CM | POA: Diagnosis not present

## 2023-10-16 DIAGNOSIS — N1831 Chronic kidney disease, stage 3a: Secondary | ICD-10-CM | POA: Diagnosis not present

## 2023-10-16 DIAGNOSIS — I10 Essential (primary) hypertension: Secondary | ICD-10-CM | POA: Diagnosis not present

## 2023-10-16 DIAGNOSIS — M353 Polymyalgia rheumatica: Secondary | ICD-10-CM | POA: Diagnosis not present

## 2023-10-16 DIAGNOSIS — E1169 Type 2 diabetes mellitus with other specified complication: Secondary | ICD-10-CM | POA: Diagnosis not present

## 2023-10-21 ENCOUNTER — Other Ambulatory Visit: Payer: Self-pay | Admitting: Family Medicine

## 2023-10-21 DIAGNOSIS — Z9889 Other specified postprocedural states: Secondary | ICD-10-CM

## 2023-11-11 ENCOUNTER — Encounter: Payer: Self-pay | Admitting: Family Medicine

## 2023-11-12 ENCOUNTER — Ambulatory Visit
Admission: RE | Admit: 2023-11-12 | Discharge: 2023-11-12 | Disposition: A | Discharge status: Home / Self Care | Source: Ambulatory Visit | Attending: Family Medicine | Admitting: Family Medicine

## 2023-11-12 DIAGNOSIS — Z9889 Other specified postprocedural states: Secondary | ICD-10-CM

## 2023-11-12 DIAGNOSIS — R92323 Mammographic fibroglandular density, bilateral breasts: Secondary | ICD-10-CM | POA: Diagnosis not present

## 2023-11-13 DIAGNOSIS — E213 Hyperparathyroidism, unspecified: Secondary | ICD-10-CM | POA: Diagnosis not present

## 2023-11-13 DIAGNOSIS — M8580 Other specified disorders of bone density and structure, unspecified site: Secondary | ICD-10-CM | POA: Diagnosis not present

## 2023-11-13 DIAGNOSIS — M1990 Unspecified osteoarthritis, unspecified site: Secondary | ICD-10-CM | POA: Diagnosis not present

## 2023-11-13 DIAGNOSIS — Z23 Encounter for immunization: Secondary | ICD-10-CM | POA: Diagnosis not present

## 2023-11-13 DIAGNOSIS — M353 Polymyalgia rheumatica: Secondary | ICD-10-CM | POA: Diagnosis not present

## 2023-11-13 DIAGNOSIS — Z7952 Long term (current) use of systemic steroids: Secondary | ICD-10-CM | POA: Diagnosis not present

## 2023-12-31 ENCOUNTER — Other Ambulatory Visit: Payer: Self-pay

## 2023-12-31 ENCOUNTER — Emergency Department (HOSPITAL_COMMUNITY)

## 2023-12-31 ENCOUNTER — Inpatient Hospital Stay (HOSPITAL_COMMUNITY)
Admission: EM | Admit: 2023-12-31 | Discharge: 2024-01-02 | DRG: 242 | Disposition: A | Discharge status: Home / Self Care | Attending: Internal Medicine | Admitting: Internal Medicine

## 2023-12-31 ENCOUNTER — Encounter (HOSPITAL_COMMUNITY): Payer: Self-pay

## 2023-12-31 DIAGNOSIS — Z79890 Hormone replacement therapy (postmenopausal): Secondary | ICD-10-CM

## 2023-12-31 DIAGNOSIS — I130 Hypertensive heart and chronic kidney disease with heart failure and stage 1 through stage 4 chronic kidney disease, or unspecified chronic kidney disease: Secondary | ICD-10-CM | POA: Diagnosis present

## 2023-12-31 DIAGNOSIS — N1832 Chronic kidney disease, stage 3b: Secondary | ICD-10-CM | POA: Diagnosis present

## 2023-12-31 DIAGNOSIS — E11649 Type 2 diabetes mellitus with hypoglycemia without coma: Secondary | ICD-10-CM | POA: Diagnosis present

## 2023-12-31 DIAGNOSIS — E1122 Type 2 diabetes mellitus with diabetic chronic kidney disease: Secondary | ICD-10-CM | POA: Diagnosis present

## 2023-12-31 DIAGNOSIS — Z7409 Other reduced mobility: Secondary | ICD-10-CM | POA: Diagnosis present

## 2023-12-31 DIAGNOSIS — E038 Other specified hypothyroidism: Secondary | ICD-10-CM | POA: Diagnosis present

## 2023-12-31 DIAGNOSIS — M48061 Spinal stenosis, lumbar region without neurogenic claudication: Secondary | ICD-10-CM | POA: Diagnosis present

## 2023-12-31 DIAGNOSIS — I442 Atrioventricular block, complete: Principal | ICD-10-CM | POA: Diagnosis present

## 2023-12-31 DIAGNOSIS — I5033 Acute on chronic diastolic (congestive) heart failure: Secondary | ICD-10-CM | POA: Diagnosis present

## 2023-12-31 DIAGNOSIS — Z7982 Long term (current) use of aspirin: Secondary | ICD-10-CM

## 2023-12-31 DIAGNOSIS — E785 Hyperlipidemia, unspecified: Secondary | ICD-10-CM | POA: Diagnosis present

## 2023-12-31 DIAGNOSIS — W19XXXA Unspecified fall, initial encounter: Secondary | ICD-10-CM

## 2023-12-31 DIAGNOSIS — Z79899 Other long term (current) drug therapy: Secondary | ICD-10-CM

## 2023-12-31 DIAGNOSIS — Z7984 Long term (current) use of oral hypoglycemic drugs: Secondary | ICD-10-CM | POA: Diagnosis not present

## 2023-12-31 DIAGNOSIS — R001 Bradycardia, unspecified: Secondary | ICD-10-CM

## 2023-12-31 LAB — CBG MONITORING, ED
Glucose-Capillary: 66 mg/dL — ABNORMAL LOW (ref 70–99)
Glucose-Capillary: 94 mg/dL (ref 70–99)

## 2023-12-31 LAB — BASIC METABOLIC PANEL WITH GFR
Anion gap: 12 (ref 5–15)
BUN: 28 mg/dL — ABNORMAL HIGH (ref 8–23)
CO2: 16 mmol/L — ABNORMAL LOW (ref 22–32)
Calcium: 9.7 mg/dL (ref 8.9–10.3)
Chloride: 113 mmol/L — ABNORMAL HIGH (ref 98–111)
Creatinine, Ser: 1.38 mg/dL — ABNORMAL HIGH (ref 0.44–1.00)
GFR, Estimated: 36 mL/min — ABNORMAL LOW (ref >60)
Glucose, Bld: 178 mg/dL — ABNORMAL HIGH (ref 70–99)
Potassium: 4.4 mmol/L (ref 3.5–5.1)
Sodium: 141 mmol/L (ref 135–145)

## 2023-12-31 LAB — CBC
HCT: 31.8 % — ABNORMAL LOW (ref 36.0–46.0)
Hemoglobin: 10.4 g/dL — ABNORMAL LOW (ref 12.0–15.0)
MCH: 32.1 pg (ref 26.0–34.0)
MCHC: 32.7 g/dL (ref 30.0–36.0)
MCV: 98.1 fL (ref 80.0–100.0)
Platelets: 201 K/uL (ref 150–400)
RBC: 3.24 MIL/uL — ABNORMAL LOW (ref 3.87–5.11)
RDW: 14 % (ref 11.5–15.5)
WBC: 9.6 K/uL (ref 4.0–10.5)
nRBC: 0 % (ref 0.0–0.2)

## 2023-12-31 LAB — BRAIN NATRIURETIC PEPTIDE: B Natriuretic Peptide: 1086.9 pg/mL — ABNORMAL HIGH (ref 0.0–100.0)

## 2023-12-31 LAB — ECHOCARDIOGRAM COMPLETE
AR max vel: 1.2 cm2
AV Area VTI: 0.97 cm2
AV Area mean vel: 1.18 cm2
AV Mean grad: 8 mmHg
AV Peak grad: 14.4 mmHg
Ao pk vel: 1.9 m/s
Area-P 1/2: 2.81 cm2
Calc EF: 78.3 %
Height: 62 in
MV VTI: 0.71 cm2
S' Lateral: 2.1 cm
Single Plane A2C EF: 77.3 %
Single Plane A4C EF: 79.5 %
Weight: 2328.06 [oz_av]

## 2023-12-31 LAB — T4, FREE: Free T4: 1.26 ng/dL — ABNORMAL HIGH (ref 0.61–1.12)

## 2023-12-31 LAB — GLUCOSE, CAPILLARY: Glucose-Capillary: 114 mg/dL — ABNORMAL HIGH (ref 70–99)

## 2023-12-31 LAB — TSH: TSH: 5.277 u[IU]/mL — ABNORMAL HIGH (ref 0.350–4.500)

## 2023-12-31 LAB — HEMOGLOBIN A1C
Hgb A1c MFr Bld: 5.6 % (ref 4.8–5.6)
Mean Plasma Glucose: 114.02 mg/dL

## 2023-12-31 MED ORDER — LEVOTHYROXINE SODIUM 88 MCG PO TABS: 88.0000 ug | ORAL_TABLET | Freq: Every day | ORAL | Status: DC

## 2023-12-31 MED ORDER — MAGNESIUM OXIDE -MG SUPPLEMENT 400 (240 MG) MG PO TABS
400.0000 mg | ORAL_TABLET | Freq: Every day | ORAL | Status: DC
  Filled 2023-12-31 (×3): qty 1

## 2023-12-31 MED ORDER — LISINOPRIL 20 MG PO TABS
20.0000 mg | ORAL_TABLET | Freq: Every day | ORAL | Status: DC
Start: 2023-12-31 — End: 2024-01-02
  Administered 2023-12-31 – 2024-01-02 (×3): 20 mg via ORAL
  Filled 2023-12-31 (×3): qty 1

## 2023-12-31 MED ORDER — INSULIN ASPART 100 UNIT/ML IJ SOLN: 0.0000 [IU] | Freq: Three times a day (TID) | INTRAMUSCULAR | Status: DC

## 2023-12-31 MED ORDER — LEVOTHYROXINE SODIUM 88 MCG PO TABS
88.0000 ug | ORAL_TABLET | Freq: Every day | ORAL | Status: DC
  Administered 2023-12-31 – 2024-01-02 (×3): 88 ug via ORAL
  Filled 2023-12-31 (×3): qty 1

## 2023-12-31 MED ORDER — ROSUVASTATIN CALCIUM 20 MG PO TABS
20.0000 mg | ORAL_TABLET | Freq: Every day | ORAL | Status: DC
  Administered 2023-12-31 – 2024-01-01 (×2): 20 mg via ORAL
  Filled 2023-12-31 (×2): qty 1

## 2023-12-31 MED ORDER — FUROSEMIDE 10 MG/ML IJ SOLN
40.0000 mg | Freq: Two times a day (BID) | INTRAMUSCULAR | Status: AC
  Administered 2023-12-31 – 2024-01-01 (×2): 40 mg via INTRAVENOUS
  Filled 2023-12-31 (×2): qty 4

## 2023-12-31 MED ORDER — FUROSEMIDE 10 MG/ML IJ SOLN
40.0000 mg | Freq: Once | INTRAMUSCULAR | Status: AC
  Administered 2023-12-31: 40 mg via INTRAVENOUS
  Filled 2023-12-31: qty 4

## 2023-12-31 MED ORDER — CINACALCET HCL 30 MG PO TABS
30.0000 mg | ORAL_TABLET | Freq: Every day | ORAL | Status: DC
  Administered 2023-12-31 – 2024-01-02 (×2): 30 mg via ORAL
  Filled 2023-12-31 (×3): qty 1

## 2023-12-31 NOTE — ED Triage Notes (Signed)
 Pt to ED via [facility] from home. Pt fell while trying to use bathroom earlier this morning, states her legs gave out. Pt did lay on floor for approximately an hour before she was assisted up. Pt denies hitting anything and does not take blood thinners. Pt does have bilateral leg swelling for past week.  Pt states her left knee is bad does not have pain. Pt A&Ox4 on arrival, NAD noted.  HR 42, has noticed her heart rate being in 40s for past few days Cbg 266 160/52 98% RA

## 2023-12-31 NOTE — H&P (Signed)
 History and Physical    Patient: Rhonda Singh FMW:[id number] DOB: [birth year] DOA: 12/31/2023 DOS: the patient was seen and examined on 12/31/2023 PCP: [facility]  Patient coming from: Home  Chief Complaint:  Chief Complaint  Patient presents with   Fall   HPI: Rhonda Singh is a 89 y.o. female with medical history significant of HTN, DM2, and hypothyroidism p/w GLF and found to have 3rd degree heart block.  The patient reported falling while attempting to get on the commode early in the morning, around 3:00 to 4:00 AM. The patient stated that they woke up feeling okay and went to the bathroom with her cain without difficulty, but then fell and was unable to get up, leading to the hospital visit. The patient did not report losing consciousness or blacking out.  In the ED, pt hypertensive and bradycardic. Labs notable for Cr 1.38 (baseline ), BNP 1086, TSH 5.27. EKG showed 3rd degree heart block. CT lumbar without acute fracture. XR L knee w/o fracture or malalignment. EDP consulted cardiology and requested admission.   Review of Systems: As mentioned in the history of present illness. All other systems reviewed and are negative. Past Medical History:  Diagnosis Date   Diabetes mellitus without complication (HCC)    Hypertension    Thyroid  disease    Past Surgical History:  Procedure Laterality Date   BREAST LUMPECTOMY Left 2023   BREAST LUMPECTOMY WITH RADIOACTIVE SEED LOCALIZATION Left 12/25/2021   Procedure: LEFT BREAST LUMPECTOMY WITH RADIOACTIVE SEED LOCALIZATION;  Surgeon: Ebbie Cough, MD;  Location: [facility];  Service: General;  Laterality: Left;   CATARACT EXTRACTION Bilateral    TONSILLECTOMY AND ADENOIDECTOMY     Social History:  reports that she has never smoked. She has never used smokeless tobacco. She reports that she does not currently use alcohol. She reports that she does not use drugs.  No Known  Allergies  Family History  Problem Relation Age of Onset   Breast cancer Neg Hx     Prior to Admission medications   Medication Sig Start Date End Date Taking? Authorizing Provider  aspirin  EC 81 MG tablet Take 81 mg by mouth at bedtime.   Yes [provider]  Cholecalciferol 50 MCG (2000 UT) CAPS Take 2,000 Units by mouth in the morning and at bedtime.   Yes [provider]  cinacalcet (SENSIPAR) 30 MG tablet Take 30 mg by mouth daily.   Yes [provider]  cyanocobalamin (VITAMIN B12) 1000 MCG tablet Take 1,000 mcg by mouth daily.   Yes [provider]  ezetimibe  (ZETIA ) 10 MG tablet Take 10 mg by mouth at bedtime.   Yes [provider]  levothyroxine  (SYNTHROID ) 88 MCG tablet Take 88 mcg by mouth daily before breakfast.   Yes [provider]  magnesium oxide (MAG-OX) 400 MG tablet Take 400 mg by mouth daily. 08/07/22  Yes [provider]  METFORMIN HCL PO Take 1,000 mg by mouth in the morning and at bedtime.   Yes [provider]  rosuvastatin (CRESTOR) 20 MG tablet Take 20 mg by mouth at bedtime.   Yes [provider]  lisinopril (ZESTRIL) 20 MG tablet Take 1 tablet by mouth daily. 11/24/21   [provider]  naproxen sodium (ANAPROX) 220 MG tablet Take 220 mg by mouth daily as needed. For pain Patient not taking: Reported on 12/31/2023    [provider]    Physical Exam: Vitals:   12/31/23  1000 12/31/23 1015 12/31/23 1030 12/31/23 1049  BP: (!) 170/43 (!) 160/132 (!) 151/57   Pulse: (!) 34 (!) 38 (!) 34 (!) 36  Resp: 16 (!) 22 10 14   Temp:    98.4 F (36.9 C)  TempSrc:    Oral  SpO2: 98% 100% 100% 100%  Weight:      Height:       General: Alert, oriented x3, resting comfortably in no acute distress Respiratory: Bibasilar rales; no wheezing Cardiovascular: Regular rate and rhythm w/o m/r/g Abdomen: Soft, nontender, nondistended. Positive bowel sounds   Data Reviewed:  Lab  Results  Component Value Date   WBC 9.6 12/31/2023   HGB 10.4 (L) 12/31/2023   HCT 31.8 (L) 12/31/2023   MCV 98.1 12/31/2023   PLT 201 12/31/2023   Lab Results  Component Value Date   GLUCOSE 178 (H) 12/31/2023   CALCIUM  9.7 12/31/2023   NA 141 12/31/2023   K 4.4 12/31/2023   CO2 16 (L) 12/31/2023   CL 113 (H) 12/31/2023   BUN 28 (H) 12/31/2023   CREATININE 1.38 (H) 12/31/2023   Lab Results  Component Value Date   ALT 25 09/11/2012   AST 27 09/11/2012   ALKPHOS 54 09/11/2012   BILITOT 0.6 09/11/2012   No results found for: INR, PROTIME Radiology: DG Pelvis Portable Result Date: 12/31/2023 CLINICAL DATA:  Fall while trying to use the bathroom this morning. EXAM: PORTABLE PELVIS 1-2 VIEWS COMPARISON:  None Available. 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 No evidence of femoral fracture. Subtle lucency involving over the left inferior pubic ramus which appears extend off the bone into the soft tissues likely overlapping soft tissue fat plane. No definite acute fracture. Degenerative changes of the spine. Small calcified uterine fibroid over the right pelvis. IMPRESSION: 1. No acute findings. 2. 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 Electronically Signed   By: Toribio Agreste M.D.   On: 12/31/2023 08:08   DG Knee Complete 4 Views Left Result Date: 12/31/2023 EXAM: 4 OR MORE VIEW(S) XRAY OF THE LEFT KNEE 12/31/2023 07:21:00 AM COMPARISON: None available. CLINICAL HISTORY: Fall. Reason for exam: fall. Per triage notes: Pt to ED via [facility] from home. Pt fell while trying to use bathroom earlier this morning, states her legs gave out. Pt did lay on floor for approximately an hour before she was assisted up. Pt denies hitting anything and does not take blood thinners. Pt does have bilateral leg swelling for past week. Pt states her left knee is bad does not have pain. Pt A\T\Ox4 on arrival, NAD noted. FINDINGS: BONES AND JOINTS: There is osteopenia  without evidence of fracture, dislocation, or joint effusion. There is mild narrowing and spurring of the patellofemoral and medial femorotibial joints without evidence of erosive arthropathy or advanced arthrosis. No focal pathologic bone lesion is seen. SOFT TISSUES: There is generalized stranding edema in the visualized left lower extremity. Scattered calcific plaques of the superficial femoral artery. IMPRESSION: 1. No acute fracture or dislocation. 2. Generalized soft tissue edema in the visualized left lower extremity. 3. Osteopenia with Mild osteoarthrosis of the patellofemoral and medial femorotibial compartments. Electronically signed by: Francis Quam MD 12/31/2023 08:01 AM EDT RP Workstation: [id number]   DG Knee Complete 4 Views Right Result Date: 12/31/2023 EXAM: 4 OR MORE VIEW(S) XRAY OF THE RIGHT KNEE 12/31/2023 07:21:00 AM COMPARISON: None available. CLINICAL HISTORY: Fall. Reason for exam: fall. Per triage notes: Pt to ED via [facility] from home. Pt fell while trying  to use bathroom earlier this morning, states her legs gave out. Pt did lay on floor for approximately an hour before she was assisted up. Pt denies hitting anything and does not take blood thinners. Pt does have bilateral leg swelling for past week. Pt states her left knee is 'bad' does not have pain. Pt A\T\Ox4 on arrival, NAD noted. FINDINGS: BONES AND JOINTS: There is no evidence of fracture, dislocation, or joint effusion. No focal osseous lesion. There is mild narrowing and spurring of the patellofemoral and medial femorotibial joints without erosive arthropathy or advanced arthrosis. SOFT TISSUES: There is generalized subcutaneous stranding edema in the right lower extremity. IMPRESSION: 1. No acute fracture or dislocation. 2. Generalized subcutaneous stranding edema in the right lower extremity. 3. Mild degenerative changes of the patellofemoral and medial femorotibial joints. Electronically signed by: Francis Quam MD  12/31/2023 07:59 AM EDT RP Workstation: [id number]   CT Lumbar Spine Wo Contrast Result Date: 12/31/2023 EXAM: CT OF THE LUMBAR SPINE WITHOUT CONTRAST 12/31/2023 07:32:00 AM TECHNIQUE: CT of the lumbar spine was performed without the administration of intravenous contrast. Multiplanar reformatted images are provided for review. Automated exposure control, iterative reconstruction, and/or weight based adjustment of the mA/kV was utilized to reduce the radiation dose to as low as reasonably achievable. COMPARISON: None available. CLINICAL HISTORY: FINDINGS: BONES AND ALIGNMENT: Normal vertebral body heights. No acute fracture or suspicious bone lesion. Mild lumbar levoscoliosis, apex at L3-L4. Minimal grade 1 anterolisthesis at L4-L5. Lumbar segmentation is standard but with a partial right hemisacralization of L5. Generalized osteopenia. Mild lumbar spondylosis. The spinous process abutment with opposing surface spurring and cortical sclerosis at L3-L4 and L4-L5. The visualized SI joints are patent with vacuum phenomenon and mild spurring. DEGENERATIVE CHANGES: T10-T11 through L2-L3: Normal disc heights. No herniation or canal stenosis. At L2-L3, there is a mild generalized annular bulge but it is nonstenosing. The bilateral foramina are clear at these levels. There is trace facet spurring at L1-L2 and mild facet spurring at L2-L3. L3-L4: Moderate disc space loss with vacuum phenomenon and peridiscal endplate cortical thickening eccentric to the right. Circumferential disc osteophyte complex eccentric to the right far lateral aspect. Dorsal ligamentous thickening. These cause partial effacement of both subarticular zones and mild to moderate spinal canal stenosis. Mild facet hypertrophy with unilateral mild right foraminal stenosis. L4-L5: Disc is normal in height. Broad-based posterior disc bulge with bulging continuing into the lower foramina. Dorsal ligamentous thickening. These cause mild to moderate spinal  canal stenosis with effacement of the subarticular zones. Unilateral mild to moderate left foraminal stenosis. L5-S1: Slight disc space loss. No substantial disc bulge. No herniation or stenosis. Moderate facet hypertrophy but foraminal stenosis is noted only on the left, where it is mild. SOFT TISSUES: No paravertebral mass, fluid collection or hematoma. VASCULATURE: Heavy aortoiliac calcific plaque. No AAA. LUNGS AND PLEURAL SPACES: Bilateral small layering pleural effusions as noted on portable chest. Visualized lung bases are otherwise clear. KIDNEYS, URETERS, AND BLADDER: No nephrolithiasis or hydronephrosis. IMPRESSION: 1. No evidence of acute traumatic injury. 2. Moderate disc space loss at L3-L4 with associated degenerative changes resulting in mild to moderate spinal canal stenosis and mild right foraminal stenosis. 3. Broad-based posterior disc bulge at L4-L5 with dorsal ligamentous thickening resulting in mild to moderate spinal canal stenosis and mild to moderate left foraminal stenosis. 4. Osteopenia and additional degenerative changes detailed above. Electronically signed by: Francis Quam MD 12/31/2023 07:57 AM EDT RP Workstation: [id number]   DG Chest Portable 1 View Result  Date: 12/31/2023 EXAM: 1 VIEW(S) XRAY OF THE CHEST 12/31/2023 07:21:00 AM COMPARISON: PA and lateral chest 09/11/2012. CLINICAL HISTORY: fall. Reason for exam: fall ; Per triage notes: Pt to ED via [facility] from home. Pt fell while trying to use bathroom earlier this morning, states her legs gave out. Pt did lay on floor for approximately an hour before she was assisted up. Pt denies hitting anything and does not take ; blood thinners. Pt does have bilateral leg swelling for past week. Pt states her left knee is bad does not have pain. Pt A\T\Ox4 on arrival, NAD noted. FINDINGS: LINES, TUBES AND DEVICES: There is an electrical pad overlying the left chest, multiple overlying monitor wires mostly on the right. LUNGS AND  PLEURA: The lungs are hypoinflated with bronchovascular crowding in the bases but no convincing focal infiltrate. There are bilateral minimal pleural effusions. No pulmonary edema. No pneumothorax. HEART AND MEDIASTINUM: The heart is slightly enlarged. No vascular congestion is seen. The mediastinum is normally outlined. There is aortic atherosclerosis. BONES AND SOFT TISSUES: There is osteopenia and degenerative change of the spine. No acute osseous abnormality. IMPRESSION: 1. No acute cardiopulmonary process identified. 2. Cardiomegaly without vascular congestion. 3. Minimal  bilateral pleural effusions. 4. Hypoinflation with basilar bronchovascular crowding, without focal airspace disease. Electronically signed by: Francis Quam MD 12/31/2023 07:40 AM EDT RP Workstation: [id number]    Assessment and Plan: 38F h/o HTN, DM2, and hypothyroidism p/w GLF and found to have 3rd degree heart block.  3rd degree heart block -Cards/EP consulted; apprec eval/recs (PPM planned for tomorrow) -HOLD pta ASA 81gm daily until PPM placed tomorrow  ADHF Elevated BNP Presumably 2/2 heart block above -Cards consulted; apprec eval/recs -IV lasix 40mg  BID for now; goal net neg 1-2L/d; strict I/Os; daily standing weights; K>4/Mg>2  GLF -PT consulted; apprec eval/recs   HTN -PTA lisinopril 20mg  daily  Subclinical hypothyroidism -PTA Synthroid  88mcg daily -Repeat TFTs in 8-12 weeks   Advance Care Planning:   Code Status: Full Code   Consults: Cards/EP  Family Communication: Daughter  Severity of Illness: The appropriate patient status for this patient is INPATIENT. Inpatient status is judged to be reasonable and necessary in order to provide the required intensity of service to ensure the patient's safety. The patient's presenting symptoms, physical exam findings, and initial radiographic and laboratory data in the context of their chronic comorbidities is felt to place them at high risk for further  clinical deterioration. Furthermore, it is not anticipated that the patient will be medically stable for discharge from the hospital within 2 midnights of admission.   * I certify that at the point of admission it is my clinical judgment that the patient will require inpatient hospital care spanning beyond 2 midnights from the point of admission due to high intensity of service, high risk for further deterioration and high frequency of surveillance required.*   ------- I spent 55 minutes reviewing previous notes, at the bedside counseling/discussing the treatment plan, and performing clinical documentation.  Author: Marsha Ada, MD 12/31/2023 10:57 AM  For on call review [email or web address].

## 2023-12-31 NOTE — Progress Notes (Signed)
  Echocardiogram 2D Echocardiogram has been performed.  Rhonda Singh [facility] 12/31/2023, 11:38 AM

## 2023-12-31 NOTE — Consult Note (Addendum)
 ELECTROPHYSIOLOGY CONSULT NOTE    Patient ID: Rhonda Singh MRN: [id number], DOB/AGE: Jul 25, 1930 89 y.o.  Admit date: 12/31/2023 Date of Consult: 12/31/2023  Primary Physician: [facility] Primary Cardiologist: None  Electrophysiologist: New   Referring Provider: Dr. Bernard  Patient Profile: Rhonda Singh is a 89 y.o. female with a history of HTN, DM2, and hypothyroidism who is being seen today for the evaluation of heart block at the request of Dr. Bernard.  HPI:  Rhonda Singh is a 89 y.o. female with medical h/o as above who presented to [facility] via EMS after unwitnessed fall. Pt reports her legs gave out and she couldn't get back up. It was approximately one hour before anyone came to help her.  EP asked to see with CHB. IM asked to admit given advanced age and co-morbidities, as well as on-going work up for pain from falling.  Currently, she is asymptomatic at rest. Has had fatigue and noticed worsened peripheral edema over the past week. Reports HRs have mostly been in the 40s, occasionally 50s. BP runs a little high.  Denies syncope, chest pain, or orthopnea. Mild DOE with moderate exertion but otherwise fairly active. She has been having knee pain, worse after fall, as well as LBP.  Her daughter is present who also has a pacemaker.   Labs Potassium4.4 (10/28 0703)   Creatinine, ser  1.38* (10/28 0703) PLT  201 (10/28 0703) HGB  10.4* (10/28 0703) WBC 9.6 (10/28 0703)  .    Past Medical History:  Diagnosis Date   Diabetes mellitus without complication (HCC)    Hypertension    Thyroid  disease      Surgical History:  Past Surgical History:  Procedure Laterality Date   BREAST LUMPECTOMY Left 2023   BREAST LUMPECTOMY WITH RADIOACTIVE SEED LOCALIZATION Left 12/25/2021   Procedure: LEFT BREAST LUMPECTOMY WITH RADIOACTIVE SEED LOCALIZATION;  Surgeon: Ebbie Cough, MD;  Location: [facility];  Service: General;   Laterality: Left;   CATARACT EXTRACTION Bilateral    TONSILLECTOMY AND ADENOIDECTOMY       (Not in a hospital admission)   Inpatient Medications:   furosemide  40 mg Intravenous Once    Allergies: No Known Allergies  Family History  Problem Relation Age of Onset   Breast cancer Neg Hx      Physical Exam: Vitals:   12/31/23 0745 12/31/23 0800 12/31/23 0815 12/31/23 0830  BP: (!) 153/52 (!) 165/143 (!) 144/54 (!) 140/55  Pulse: (!) 35 (!) 43 65 (!) 34  Resp: 12 (!) 43 (!) 22 18  Temp:      TempSrc:      SpO2: 98% 98% 100% 100%  Weight:      Height:        GEN- NAD, A&O x 3, normal affect HEENT: Normocephalic, atraumatic Lungs- CTAB, Normal effort.  Heart- Slow but rate and rhythm, No M/G/R appreciated GI- Soft, NT, ND.  Extremities- No clubbing or cyanosis. Soft bilateral edema.   Radiology/Studies: DG Pelvis Portable Result Date: 12/31/2023 CLINICAL DATA:  Fall while trying to use the bathroom this morning. EXAM: PORTABLE PELVIS 1-2 VIEWS COMPARISON:  None Available. 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 No evidence of femoral fracture. Subtle lucency involving over the left inferior pubic ramus which appears extend off the bone into the soft tissues likely overlapping soft tissue fat plane. No definite acute fracture. Degenerative changes of the spine. Small calcified uterine fibroid over the right pelvis. IMPRESSION:  1. No acute findings. 2. 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 Electronically Signed   By: Toribio Agreste M.D.   On: 12/31/2023 08:08   DG Knee Complete 4 Views Left Result Date: 12/31/2023 EXAM: 4 OR MORE VIEW(S) XRAY OF THE LEFT KNEE 12/31/2023 07:21:00 AM COMPARISON: None available. CLINICAL HISTORY: Fall. Reason for exam: fall. Per triage notes: Pt to ED via [facility] from home. Pt fell while trying to use bathroom earlier this morning, states her legs gave out. Pt did lay on floor for approximately an hour before she was  assisted up. Pt denies hitting anything and does not take blood thinners. Pt does have bilateral leg swelling for past week. Pt states her left knee is bad does not have pain. Pt A\T\Ox4 on arrival, NAD noted. FINDINGS: BONES AND JOINTS: There is osteopenia without evidence of fracture, dislocation, or joint effusion. There is mild narrowing and spurring of the patellofemoral and medial femorotibial joints without evidence of erosive arthropathy or advanced arthrosis. No focal pathologic bone lesion is seen. SOFT TISSUES: There is generalized stranding edema in the visualized left lower extremity. Scattered calcific plaques of the superficial femoral artery. IMPRESSION: 1. No acute fracture or dislocation. 2. Generalized soft tissue edema in the visualized left lower extremity. 3. Osteopenia with Mild osteoarthrosis of the patellofemoral and medial femorotibial compartments. Electronically signed by: Francis Quam MD 12/31/2023 08:01 AM EDT RP Workstation: [id number]   DG Knee Complete 4 Views Right Result Date: 12/31/2023 EXAM: 4 OR MORE VIEW(S) XRAY OF THE RIGHT KNEE 12/31/2023 07:21:00 AM COMPARISON: None available. CLINICAL HISTORY: Fall. Reason for exam: fall. Per triage notes: Pt to ED via [facility] from home. Pt fell while trying to use bathroom earlier this morning, states her legs gave out. Pt did lay on floor for approximately an hour before she was assisted up. Pt denies hitting anything and does not take blood thinners. Pt does have bilateral leg swelling for past week. Pt states her left knee is 'bad' does not have pain. Pt A\T\Ox4 on arrival, NAD noted. FINDINGS: BONES AND JOINTS: There is no evidence of fracture, dislocation, or joint effusion. No focal osseous lesion. There is mild narrowing and spurring of the patellofemoral and medial femorotibial joints without erosive arthropathy or advanced arthrosis. SOFT TISSUES: There is generalized subcutaneous stranding edema in the right lower  extremity. IMPRESSION: 1. No acute fracture or dislocation. 2. Generalized subcutaneous stranding edema in the right lower extremity. 3. Mild degenerative changes of the patellofemoral and medial femorotibial joints. Electronically signed by: Francis Quam MD 12/31/2023 07:59 AM EDT RP Workstation: [id number]   CT Lumbar Spine Wo Contrast Result Date: 12/31/2023 EXAM: CT OF THE LUMBAR SPINE WITHOUT CONTRAST 12/31/2023 07:32:00 AM TECHNIQUE: CT of the lumbar spine was performed without the administration of intravenous contrast. Multiplanar reformatted images are provided for review. Automated exposure control, iterative reconstruction, and/or weight based adjustment of the mA/kV was utilized to reduce the radiation dose to as low as reasonably achievable. COMPARISON: None available. CLINICAL HISTORY: FINDINGS: BONES AND ALIGNMENT: Normal vertebral body heights. No acute fracture or suspicious bone lesion. Mild lumbar levoscoliosis, apex at L3-L4. Minimal grade 1 anterolisthesis at L4-L5. Lumbar segmentation is standard but with a partial right hemisacralization of L5. Generalized osteopenia. Mild lumbar spondylosis. The spinous process abutment with opposing surface spurring and cortical sclerosis at L3-L4 and L4-L5. The visualized SI joints are patent with vacuum phenomenon and mild spurring. DEGENERATIVE CHANGES: T10-T11 through L2-L3: Normal disc heights. No herniation or canal stenosis.  At L2-L3, there is a mild generalized annular bulge but it is nonstenosing. The bilateral foramina are clear at these levels. There is trace facet spurring at L1-L2 and mild facet spurring at L2-L3. L3-L4: Moderate disc space loss with vacuum phenomenon and peridiscal endplate cortical thickening eccentric to the right. Circumferential disc osteophyte complex eccentric to the right far lateral aspect. Dorsal ligamentous thickening. These cause partial effacement of both subarticular zones and mild to moderate spinal canal  stenosis. Mild facet hypertrophy with unilateral mild right foraminal stenosis. L4-L5: Disc is normal in height. Broad-based posterior disc bulge with bulging continuing into the lower foramina. Dorsal ligamentous thickening. These cause mild to moderate spinal canal stenosis with effacement of the subarticular zones. Unilateral mild to moderate left foraminal stenosis. L5-S1: Slight disc space loss. No substantial disc bulge. No herniation or stenosis. Moderate facet hypertrophy but foraminal stenosis is noted only on the left, where it is mild. SOFT TISSUES: No paravertebral mass, fluid collection or hematoma. VASCULATURE: Heavy aortoiliac calcific plaque. No AAA. LUNGS AND PLEURAL SPACES: Bilateral small layering pleural effusions as noted on portable chest. Visualized lung bases are otherwise clear. KIDNEYS, URETERS, AND BLADDER: No nephrolithiasis or hydronephrosis. IMPRESSION: 1. No evidence of acute traumatic injury. 2. Moderate disc space loss at L3-L4 with associated degenerative changes resulting in mild to moderate spinal canal stenosis and mild right foraminal stenosis. 3. Broad-based posterior disc bulge at L4-L5 with dorsal ligamentous thickening resulting in mild to moderate spinal canal stenosis and mild to moderate left foraminal stenosis. 4. Osteopenia and additional degenerative changes detailed above. Electronically signed by: Francis Quam MD 12/31/2023 07:57 AM EDT RP Workstation: [id number]   DG Chest Portable 1 View Result Date: 12/31/2023 EXAM: 1 VIEW(S) XRAY OF THE CHEST 12/31/2023 07:21:00 AM COMPARISON: PA and lateral chest 09/11/2012. CLINICAL HISTORY: fall. Reason for exam: fall ; Per triage notes: Pt to ED via [facility] from home. Pt fell while trying to use bathroom earlier this morning, states her legs gave out. Pt did lay on floor for approximately an hour before she was assisted up. Pt denies hitting anything and does not take ; blood thinners. Pt does have bilateral leg swelling  for past week. Pt states her left knee is bad does not have pain. Pt A\T\Ox4 on arrival, NAD noted. FINDINGS: LINES, TUBES AND DEVICES: There is an electrical pad overlying the left chest, multiple overlying monitor wires mostly on the right. LUNGS AND PLEURA: The lungs are hypoinflated with bronchovascular crowding in the bases but no convincing focal infiltrate. There are bilateral minimal pleural effusions. No pulmonary edema. No pneumothorax. HEART AND MEDIASTINUM: The heart is slightly enlarged. No vascular congestion is seen. The mediastinum is normally outlined. There is aortic atherosclerosis. BONES AND SOFT TISSUES: There is osteopenia and degenerative change of the spine. No acute osseous abnormality. IMPRESSION: 1. No acute cardiopulmonary process identified. 2. Cardiomegaly without vascular congestion. 3. Minimal  bilateral pleural effusions. 4. Hypoinflation with basilar bronchovascular crowding, without focal airspace disease. Electronically signed by: Francis Quam MD 12/31/2023 07:40 AM EDT RP Workstation: [id number]    EKG: on arrival shows EKG with CHB at 39 bpm (personally reviewed)  Baseline EKG 12/20/2021 without conduction disease  TELEMETRY: CHB in 30-40s (personally reviewed)  Assessment/Plan:  CHB Reports HR in 40s over the last several days, with weakness x 1 week No AV nodal agents on board. Echocardiogram pending for completeness, unlikely to change recommendation for pacing.  Explained risks, benefits, and alternatives to PPM implantation, including but not  limited to bleeding, infection, pneumothorax, pericardial effusion, lead dislodgement, heart attack, stroke, or death.  Pt verbalized understanding and agrees to proceed.      Peripheral edema Echo pending Able to lie flat Agree with IV lasix, consider re-dose pending response.    HTN DM2 LE pain s/p fall Per IM.  EP Demetrick Eichenberger follow along, tentatively planning PPM tomorrow. If she becomes hemodynamically  stable would need to move to unit and consider temp wire; otherwise OK for progressive.   For questions or updates, please contact [facility] Please consult [email or web address] for contact info under     Signed, Ozell Prentice Passey, PA-C  12/31/2023, 8:43 AM        Rhonda Singh was seen by me today along with Jodie Passey. I have personally performed an evaluation on this patient.  My findings are as follows: 89 y.o. female with a past medical history as above.  Had an unwitnessed fall.  She states her legs gave out and she could not get back up.  She has been having increased levels of fatigue at home.  On presentation in the emergency room, she was noted to be in complete heart block.  Heart rates are in the 40s.  When she is at rest she feels well, but does notice dyspnea on exertion as well as generalized fatigue.  Data: EKG(s) and pertinent labs, studies, etc were personally reviewed and interpreted by me:  EKG, telemetry Otherwise, I agree with data as outlined by the advanced practice provider.  Exam performed by me: Gen: No acute distress Neck: No JVD Cardiac: Bradycardic Lungs: Normal work of breathing Extremities: No edema  My Assessment and Plan:  1.  Complete heart block: No reversible cause.  Echo within normal ejection fraction.  She Jakhi Dishman need pacemaker implantation.  Risks and benefits have been discussed.  Ebonye Reade plan for pacemaker implant tomorrow.  Signed,  Reyonna Haack Gladis Norton, MD  12/31/2023 2:03 PM

## 2023-12-31 NOTE — ED Provider Notes (Signed)
 [place] [facility] AT [facility] Provider Note   CSN: [id number] Arrival date & time: 12/31/23  9370     Patient presents with: Rhonda Singh is a 89 y.o. female past medical history significant hypertension, diabetes who presents with concern for what she feels was a mechanical fall while trying to use the bathroom earlier this morning.  She reports that she fell like her legs gave out.  She laid on the floor for approximately an hour before being assisted up.  She does not feel like she hit her head, neck, she did not lose consciousness.  She endorses some bilateral leg pain especially at the knees, left greater than right.  She endorses some mild low back pain.  She has not taken anything for pain prior to arrival.  She has noticed some leg swelling over the last week, no previous history of heart failure, kidney disease.  Low heart rate for the last few days, heart rate around 40s.    Fall       Prior to Admission medications   Medication Sig Start Date End Date Taking? Authorizing Provider  aspirin  EC 81 MG tablet Take 81 mg by mouth at bedtime.   Yes [provider]  Cholecalciferol 50 MCG (2000 UT) CAPS Take 2,000 Units by mouth in the morning and at bedtime.   Yes [provider]  cinacalcet (SENSIPAR) 30 MG tablet Take 30 mg by mouth daily.   Yes [provider]  cyanocobalamin (VITAMIN B12) 1000 MCG tablet Take 1,000 mcg by mouth daily.   Yes [provider]  ezetimibe  (ZETIA ) 10 MG tablet Take 10 mg by mouth at bedtime.   Yes [provider]  levothyroxine  (SYNTHROID ) 88 MCG tablet Take 88 mcg by mouth daily before breakfast.   Yes [provider]  magnesium oxide (MAG-OX) 400 MG tablet Take 400 mg by mouth daily. 08/07/22  Yes [provider]  METFORMIN HCL PO Take 1,000 mg by mouth in the morning and at bedtime.   Yes [provider]  rosuvastatin (CRESTOR) 20 MG  tablet Take 20 mg by mouth at bedtime.   Yes [provider]  lisinopril (ZESTRIL) 20 MG tablet Take 1 tablet by mouth daily. 11/24/21   [provider]  naproxen sodium (ANAPROX) 220 MG tablet Take 220 mg by mouth daily as needed. For pain Patient not taking: Reported on 12/31/2023    [provider]    Allergies: Patient has no known allergies.    Review of Systems  All other systems reviewed and are negative.   Updated Vital Signs BP (!) 140/55   Pulse (!) 34   Temp (!) 96.8 F (36 C) (Temporal)   Resp 18   Ht 5' 2 (1.575 m)   Wt 66 kg   SpO2 100%   BMI 26.61 kg/m   Physical Exam Vitals and nursing note reviewed.  Constitutional:      General: She is not in acute distress.    Appearance: Normal appearance.  HENT:     Head: Normocephalic and atraumatic.  Eyes:     General:        Right eye: No discharge.        Left eye: No discharge.  Cardiovascular:     Rate and Rhythm: Regular rhythm. Bradycardia present.     Heart sounds: No murmur heard.    No friction rub. No gallop.  Pulmonary:     Effort: Pulmonary  effort is normal.     Breath sounds: Normal breath sounds.  Abdominal:     General: Bowel sounds are normal.     Palpations: Abdomen is soft.  Musculoskeletal:     Comments: Some mild tenderness to palpation bilateral knees, she endorses the most pain with active flexion, extension, no obvious step-off or deformity throughout.  No large joint effusion.  She does have some bilateral lower extremity edema, 1-2+.  Equal bilaterally.  Skin:    General: Skin is warm and dry.     Capillary Refill: Capillary refill takes less than 2 seconds.  Neurological:     Mental Status: She is alert and oriented to person, place, and time.  Psychiatric:        Mood and Affect: Mood normal.        Behavior: Behavior normal.     (all labs ordered are listed, but only abnormal results are displayed) Labs Reviewed  CBC - Abnormal; Notable for the  following components:      Result Value   RBC 3.24 (*)    Hemoglobin 10.4 (*)    HCT 31.8 (*)    All other components within normal limits  BASIC METABOLIC PANEL WITH GFR - Abnormal; Notable for the following components:   Chloride 113 (*)    CO2 16 (*)    Glucose, Bld 178 (*)    BUN 28 (*)    Creatinine, Ser 1.38 (*)    GFR, Estimated 36 (*)    All other components within normal limits  BRAIN NATRIURETIC PEPTIDE - Abnormal; Notable for the following components:   B Natriuretic Peptide 1,086.9 (*)    All other components within normal limits  TSH - Abnormal; Notable for the following components:   TSH 5.277 (*)    All other components within normal limits  T4, FREE - Abnormal; Notable for the following components:   Free T4 1.26 (*)    All other components within normal limits    EKG: EKG Interpretation Date/Time:  Tuesday December 31 2023 06:51:55 EDT Ventricular Rate:  39 PR Interval:    QRS Duration:  107 QT Interval:  544 QTC Calculation: 433 R Axis:   88  Text Interpretation: AV block, complete (third degree) Non-specific ST-t changes Confirmed by Bernard Drivers ([id number]) on 12/31/2023 7:04:48 AM  Radiology: [facility] Pelvis Portable Result Date: 12/31/2023 CLINICAL DATA:  Fall while trying to use the bathroom this morning. EXAM: PORTABLE PELVIS 1-2 VIEWS COMPARISON:  None Available. 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 No evidence of femoral fracture. Subtle lucency involving over the left inferior pubic ramus which appears extend off the bone into the soft tissues likely overlapping soft tissue fat plane. No definite acute fracture. Degenerative changes of the spine. Small calcified uterine fibroid over the right pelvis. IMPRESSION: 1. No acute findings. 2. 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 Electronically Signed   By: Toribio Agreste M.D.   On: 12/31/2023 08:08   DG Knee Complete 4 Views Left Result Date: 12/31/2023 EXAM: 4 OR MORE VIEW(S)  XRAY OF THE LEFT KNEE 12/31/2023 07:21:00 AM COMPARISON: None available. CLINICAL HISTORY: Fall. Reason for exam: fall. Per triage notes: Pt to ED via [facility] from home. Pt fell while trying to use bathroom earlier this morning, states her legs gave out. Pt did lay on floor for approximately an hour before she was assisted up. Pt denies hitting anything and does not take blood thinners. Pt does have bilateral leg swelling for  past week. Pt states her left knee is bad does not have pain. Pt A\T\Ox4 on arrival, NAD noted. FINDINGS: BONES AND JOINTS: There is osteopenia without evidence of fracture, dislocation, or joint effusion. There is mild narrowing and spurring of the patellofemoral and medial femorotibial joints without evidence of erosive arthropathy or advanced arthrosis. No focal pathologic bone lesion is seen. SOFT TISSUES: There is generalized stranding edema in the visualized left lower extremity. Scattered calcific plaques of the superficial femoral artery. IMPRESSION: 1. No acute fracture or dislocation. 2. Generalized soft tissue edema in the visualized left lower extremity. 3. Osteopenia with Mild osteoarthrosis of the patellofemoral and medial femorotibial compartments. Electronically signed by: Francis Quam MD 12/31/2023 08:01 AM EDT RP Workstation: [id number]   DG Knee Complete 4 Views Right Result Date: 12/31/2023 EXAM: 4 OR MORE VIEW(S) XRAY OF THE RIGHT KNEE 12/31/2023 07:21:00 AM COMPARISON: None available. CLINICAL HISTORY: Fall. Reason for exam: fall. Per triage notes: Pt to ED via [facility] from home. Pt fell while trying to use bathroom earlier this morning, states her legs gave out. Pt did lay on floor for approximately an hour before she was assisted up. Pt denies hitting anything and does not take blood thinners. Pt does have bilateral leg swelling for past week. Pt states her left knee is 'bad' does not have pain. Pt A\T\Ox4 on arrival, NAD noted. FINDINGS: BONES AND JOINTS: There  is no evidence of fracture, dislocation, or joint effusion. No focal osseous lesion. There is mild narrowing and spurring of the patellofemoral and medial femorotibial joints without erosive arthropathy or advanced arthrosis. SOFT TISSUES: There is generalized subcutaneous stranding edema in the right lower extremity. IMPRESSION: 1. No acute fracture or dislocation. 2. Generalized subcutaneous stranding edema in the right lower extremity. 3. Mild degenerative changes of the patellofemoral and medial femorotibial joints. Electronically signed by: Francis Quam MD 12/31/2023 07:59 AM EDT RP Workstation: [id number]   CT Lumbar Spine Wo Contrast Result Date: 12/31/2023 EXAM: CT OF THE LUMBAR SPINE WITHOUT CONTRAST 12/31/2023 07:32:00 AM TECHNIQUE: CT of the lumbar spine was performed without the administration of intravenous contrast. Multiplanar reformatted images are provided for review. Automated exposure control, iterative reconstruction, and/or weight based adjustment of the mA/kV was utilized to reduce the radiation dose to as low as reasonably achievable. COMPARISON: None available. CLINICAL HISTORY: FINDINGS: BONES AND ALIGNMENT: Normal vertebral body heights. No acute fracture or suspicious bone lesion. Mild lumbar levoscoliosis, apex at L3-L4. Minimal grade 1 anterolisthesis at L4-L5. Lumbar segmentation is standard but with a partial right hemisacralization of L5. Generalized osteopenia. Mild lumbar spondylosis. The spinous process abutment with opposing surface spurring and cortical sclerosis at L3-L4 and L4-L5. The visualized SI joints are patent with vacuum phenomenon and mild spurring. DEGENERATIVE CHANGES: T10-T11 through L2-L3: Normal disc heights. No herniation or canal stenosis. At L2-L3, there is a mild generalized annular bulge but it is nonstenosing. The bilateral foramina are clear at these levels. There is trace facet spurring at L1-L2 and mild facet spurring at L2-L3. L3-L4: Moderate disc  space loss with vacuum phenomenon and peridiscal endplate cortical thickening eccentric to the right. Circumferential disc osteophyte complex eccentric to the right far lateral aspect. Dorsal ligamentous thickening. These cause partial effacement of both subarticular zones and mild to moderate spinal canal stenosis. Mild facet hypertrophy with unilateral mild right foraminal stenosis. L4-L5: Disc is normal in height. Broad-based posterior disc bulge with bulging continuing into the lower foramina. Dorsal ligamentous thickening. These cause mild to moderate spinal canal  stenosis with effacement of the subarticular zones. Unilateral mild to moderate left foraminal stenosis. L5-S1: Slight disc space loss. No substantial disc bulge. No herniation or stenosis. Moderate facet hypertrophy but foraminal stenosis is noted only on the left, where it is mild. SOFT TISSUES: No paravertebral mass, fluid collection or hematoma. VASCULATURE: Heavy aortoiliac calcific plaque. No AAA. LUNGS AND PLEURAL SPACES: Bilateral small layering pleural effusions as noted on portable chest. Visualized lung bases are otherwise clear. KIDNEYS, URETERS, AND BLADDER: No nephrolithiasis or hydronephrosis. IMPRESSION: 1. No evidence of acute traumatic injury. 2. Moderate disc space loss at L3-L4 with associated degenerative changes resulting in mild to moderate spinal canal stenosis and mild right foraminal stenosis. 3. Broad-based posterior disc bulge at L4-L5 with dorsal ligamentous thickening resulting in mild to moderate spinal canal stenosis and mild to moderate left foraminal stenosis. 4. Osteopenia and additional degenerative changes detailed above. Electronically signed by: Francis Quam MD 12/31/2023 07:57 AM EDT RP Workstation: [id number]   DG Chest Portable 1 View Result Date: 12/31/2023 EXAM: 1 VIEW(S) XRAY OF THE CHEST 12/31/2023 07:21:00 AM COMPARISON: PA and lateral chest 09/11/2012. CLINICAL HISTORY: fall. Reason for exam: fall ;  Per triage notes: Pt to ED via [facility] from home. Pt fell while trying to use bathroom earlier this morning, states her legs gave out. Pt did lay on floor for approximately an hour before she was assisted up. Pt denies hitting anything and does not take ; blood thinners. Pt does have bilateral leg swelling for past week. Pt states her left knee is bad does not have pain. Pt A\T\Ox4 on arrival, NAD noted. FINDINGS: LINES, TUBES AND DEVICES: There is an electrical pad overlying the left chest, multiple overlying monitor wires mostly on the right. LUNGS AND PLEURA: The lungs are hypoinflated with bronchovascular crowding in the bases but no convincing focal infiltrate. There are bilateral minimal pleural effusions. No pulmonary edema. No pneumothorax. HEART AND MEDIASTINUM: The heart is slightly enlarged. No vascular congestion is seen. The mediastinum is normally outlined. There is aortic atherosclerosis. BONES AND SOFT TISSUES: There is osteopenia and degenerative change of the spine. No acute osseous abnormality. IMPRESSION: 1. No acute cardiopulmonary process identified. 2. Cardiomegaly without vascular congestion. 3. Minimal  bilateral pleural effusions. 4. Hypoinflation with basilar bronchovascular crowding, without focal airspace disease. Electronically signed by: Francis Quam MD 12/31/2023 07:40 AM EDT RP Workstation: [id number]     Procedures   Medications Ordered in the ED  furosemide (LASIX) injection 40 mg (has no administration in time range)                                    Medical Decision Making  This patient is a 89 y.o. female  who presents to the ED for concern of fall, low heart rate, leg swelling  Differential diagnoses prior to evaluation: The emergent differential diagnosis includes, but is not limited to,  CVA, ACS, arrhythmia, vasovagal / orthostatic hypotension, sepsis, hypoglycemia, electrolyte disturbance, respiratory failure, anemia, dehydration, heat injury,  polypharmacy, malignancy, heart block, heart failure, cellulitis, DVT, versus other, considered fracture, dislocation, versus other injury related to fall. This is not an exhaustive differential.   Past Medical History / Co-morbidities / Social History: HTN, HLD, hypothyroidism  Physical Exam: Physical exam performed. The pertinent findings include: bradycardic heart rate with normal rhythm  Some mild tenderness to palpation bilateral knees, she endorses the most pain with active flexion, extension, no  obvious step-off or deformity throughout.  No large joint effusion.  She does have some bilateral lower extremity edema, 1-2+.  Equal bilaterally.  Lab Tests/Imaging studies: I personally interpreted labs/imaging and the pertinent results include: CBC notable for anemia, hemoglobin 10.4, no recent value to compare.  BMP notable for bicarb deficit, CO2 16, BUN, creatinine elevated 28, 1.38, no recent comparison.  Her free T4 is elevated at 1.26 with a TSH of 5.27, slightly abnormal but not severely so.  Her BNP is elevated at 1000, consistent with signs of heart failure with her fluid overload.  Chest x-ray with cardiomegaly, minimal bilateral pleural effusions, bronchovascular crowding, all consistent with suspected heart failure.  CT lumbar spine with some age-related degenerative disease, bulging disc without any acute compression fracture or other abnormality.  Bilateral knee x-ray with no acute fracture, osteopenia and age-related changes noted.  Pelvic x-ray with no fracture.  I agree with the radiologist interpretation.  Cardiac monitoring: EKG obtained and interpreted by myself and attending physician which shows: sinus bradycardia, with suspicion for complete heart block   Medications: I ordered medication including Lasix for heart failure.  I have reviewed the patients home medicines and have made adjustments as needed.   Consults: I spoke with the cardiologist, Dr. Francyne who will plan  on consult for patient, suspect will need emergent pacemaker placement for complete heart block.  Due to advanced age, comorbidities will plan for medicine admission.  I spoke with the hospitalist, Dr. Georgina who agrees to plan for admission at this time.  Disposition: After consideration of the diagnostic results and the patients response to treatment, I feel that patient would benefit from mission for complete heart block, symptomatic bradycardia.     Final diagnoses:  Complete heart block (HCC)  Symptomatic bradycardia  Accidental fall, initial encounter    ED Discharge Orders     None          Rosan Sherlean DEL, PA-C 12/31/23 9158    Carita Senior, MD 01/01/24 [phone]

## 2024-01-01 ENCOUNTER — Inpatient Hospital Stay (HOSPITAL_COMMUNITY): Admission: EM | Disposition: A | Payer: Self-pay | Source: Home / Self Care | Attending: Hospitalist

## 2024-01-01 DIAGNOSIS — I442 Atrioventricular block, complete: Secondary | ICD-10-CM | POA: Diagnosis not present

## 2024-01-01 HISTORY — PX: PACEMAKER IMPLANT: EP1218

## 2024-01-01 LAB — GLUCOSE, CAPILLARY
Glucose-Capillary: 119 mg/dL — ABNORMAL HIGH (ref 70–99)
Glucose-Capillary: 100 mg/dL — ABNORMAL HIGH (ref 70–99)
Glucose-Capillary: 127 mg/dL — ABNORMAL HIGH (ref 70–99)
Glucose-Capillary: 110 mg/dL — ABNORMAL HIGH (ref 70–99)

## 2024-01-01 LAB — SURGICAL PCR SCREEN
MRSA, PCR: NEGATIVE
Staphylococcus aureus: NEGATIVE

## 2024-01-01 SURGERY — PACEMAKER IMPLANT

## 2024-01-01 MED ORDER — SODIUM CHLORIDE 0.9 % IV SOLN: 80.0000 mg | INTRAVENOUS | Status: AC

## 2024-01-01 MED ORDER — SODIUM CHLORIDE 0.9 % IV SOLN
INTRAVENOUS | Status: AC
  Administered 2024-01-01: 80 mg
  Filled 2024-01-01: qty 2

## 2024-01-01 MED ORDER — MIDAZOLAM HCL 5 MG/5ML IJ SOLN
INTRAMUSCULAR | Status: DC | PRN
  Administered 2024-01-01: 1 mg via INTRAVENOUS

## 2024-01-01 MED ORDER — CEFAZOLIN SODIUM-DEXTROSE 2-4 GM/100ML-% IV SOLN
INTRAVENOUS | Status: AC
  Administered 2024-01-01: 2 g via INTRAVENOUS
  Filled 2024-01-01: qty 100

## 2024-01-01 MED ORDER — EZETIMIBE 10 MG PO TABS
10.0000 mg | ORAL_TABLET | Freq: Every day | ORAL | Status: DC
  Administered 2024-01-01: 10 mg via ORAL
  Filled 2024-01-01: qty 1

## 2024-01-01 MED ORDER — ACETAMINOPHEN 325 MG PO TABS
975.0000 mg | ORAL_TABLET | Freq: Three times a day (TID) | ORAL | Status: DC
  Administered 2024-01-01 (×2): 975 mg via ORAL
  Filled 2024-01-01 (×3): qty 3

## 2024-01-01 MED ORDER — LIDOCAINE HCL (PF) 1 % IJ SOLN
INTRAMUSCULAR | Status: DC | PRN
  Administered 2024-01-01: 30 mL

## 2024-01-01 MED ORDER — ACETAMINOPHEN 325 MG PO TABS: 325.0000 mg | ORAL_TABLET | ORAL | Status: DC | PRN

## 2024-01-01 MED ORDER — ONDANSETRON HCL 4 MG/2ML IJ SOLN: 4.0000 mg | Freq: Four times a day (QID) | INTRAMUSCULAR | Status: DC | PRN

## 2024-01-01 MED ORDER — SODIUM CHLORIDE 0.9 % IV SOLN: INTRAVENOUS | Status: DC

## 2024-01-01 MED ORDER — INSULIN ASPART 100 UNIT/ML IJ SOLN: 0.0000 [IU] | Freq: Every day | INTRAMUSCULAR | Status: DC

## 2024-01-01 MED ORDER — FENTANYL CITRATE (PF) 100 MCG/2ML IJ SOLN
INTRAMUSCULAR | Status: DC | PRN
  Administered 2024-01-01: 25 ug via INTRAVENOUS

## 2024-01-01 MED ORDER — CEFAZOLIN SODIUM-DEXTROSE 2-4 GM/100ML-% IV SOLN: 2.0000 g | INTRAVENOUS | Status: AC

## 2024-01-01 MED ORDER — INSULIN ASPART 100 UNIT/ML IJ SOLN: 0.0000 [IU] | Freq: Three times a day (TID) | INTRAMUSCULAR | Status: DC

## 2024-01-01 MED ORDER — CHLORHEXIDINE GLUCONATE 4 % EX SOLN
60.0000 mL | Freq: Once | CUTANEOUS | Status: AC
  Administered 2024-01-01: 4 via TOPICAL
  Filled 2024-01-01: qty 60

## 2024-01-01 MED ORDER — LIDOCAINE HCL (PF) 1 % IJ SOLN
INTRAMUSCULAR | Status: AC
  Filled 2024-01-01: qty 60

## 2024-01-01 MED ORDER — CHLORHEXIDINE GLUCONATE 4 % EX SOLN
60.0000 mL | Freq: Once | CUTANEOUS | Status: AC
  Administered 2024-01-01: 4 via TOPICAL
  Filled 2024-01-01: qty 15

## 2024-01-01 MED ORDER — MIDAZOLAM HCL 2 MG/2ML IJ SOLN
INTRAMUSCULAR | Status: AC
  Filled 2024-01-01: qty 2

## 2024-01-01 MED ORDER — FENTANYL CITRATE (PF) 100 MCG/2ML IJ SOLN
INTRAMUSCULAR | Status: AC
  Filled 2024-01-01: qty 2

## 2024-01-01 SURGICAL SUPPLY — 11 items
CABLE SURGICAL S-101-97-12 (CABLE) ×1 IMPLANT
CATH RIGHTSITE C315HIS02 (CATHETERS) IMPLANT
IPG PACE AZUR XT DR MRI W1DR01 (Pacemaker) IMPLANT
LEAD CAPSURE NOVUS 5076-52CM (Lead) IMPLANT
LEAD SELECT SECURE 3830 383069 (Lead) IMPLANT
PAD DEFIB RADIO PHYSIO CONN (PAD) ×1 IMPLANT
SHEATH 7FR PRELUDE SNAP 13 (SHEATH) IMPLANT
SHEATH PROBE COVER 6X72 (BAG) IMPLANT
SLITTER 6232ADJ (MISCELLANEOUS) IMPLANT
TRAY PACEMAKER INSERTION (PACKS) ×1 IMPLANT
WIRE HI TORQ VERSACORE-J 145CM (WIRE) IMPLANT

## 2024-01-01 NOTE — Progress Notes (Signed)
 Mobility Specialist Progress Note;   01/01/24 1355  Mobility  Activity Pivoted/transferred to/from [facility]  Level of Assistance Minimal assist, patient does 75% or more  Assistive Device Other (Comment) (HHA)  Distance Ambulated (ft) 4 ft  Activity Response Tolerated well  Mobility Referral Yes  Mobility visit 1 Mobility  Mobility Specialist Start Time (ACUTE ONLY) 1555  Mobility Specialist Stop Time (ACUTE ONLY) 1604  Mobility Specialist Time Calculation (min) (ACUTE ONLY) 9 min   Pt requesting assistance to [facility]. Required MinA via HHA to safely transfer to [facility] w/ pacemaker precautions. Pt able to void and perform pericare. Pt returned back to bed and left with all needs met, alarm on. RN in room.   Lauraine Erm Mobility Specialist Please contact via SecureChat or [employer] [phone]

## 2024-01-01 NOTE — Progress Notes (Signed)
 PROGRESS NOTE  Rhonda Singh  DOB: [birth year]  PCP: [facility] FMW:[id number]  DOA: 12/31/2023  LOS: 1 day  Hospital Day: 2  Subjective: Patient was seen and examined this morning.   Pleasant elderly Caucasian female.  Lying down in bed.  Not in distress.  Daughter at bedside. Remains afebrile, persistently bradycardic to 30s and 40s, blood pressure 130s and 140s, breathing on room air Blood sugar level was as low as 66 in the last 24 hours.  119 today  Brief narrative: Rhonda Singh is a 89 y.o. female with PMH significant for DM2, HTN, hypothyroidism 10/28, patient was brought to the ED by EMS from home after an unwitnessed fall.  Patient reported falling to the floor while attempting to get on the commode in the morning around 3 AM.  Her leg gave out and she could not get back up.  It was about an hour before she could get any help.  Did not pass out.  In the ED, patient was bradycardic to 30s and hypertensive to 170s Initial labs with BNP over 1000, BUN/creatinine 28/1.38, serum bicarb low at 16 EKG showed third-degree heart block Skeletal survey without any acute fracture EDP consulted cardiology Admitted to [facility]  Assessment and plan: Complete heart block Heart rate in 30s and 40s.  EKG at presentation showed complete heart block.   Not on any AV nodal blocking agent. Echo with normal EF EP plans for pacemaker implantation   Acute exacerbation of CHF Hypertension Had bilateral pedal edema, elevated BNP  Echo with EF 70 to 75%, hyperdynamic function, mild to moderate MR, TR -secondary bradycardia Given 3 doses of IV Lasix so far.  I would encourage ace wrapping of both legs PTA meds- lisinopril 20 mg daily Continue the same  Type 2 diabetes mellitus Hypoglycemia A1c 5.6 on 12/31/2023  PTA meds-metformin 1000 mg twice daily Currently on SSI/Accu-Cheks.  Blood sugar level was low at 66 yesterday.  Better today Recent Labs  Lab  12/31/23 1137 12/31/23 1532 12/31/23 2136 01/01/24 0813 01/01/24 1203  GLUCAP 66* 94 114* 119* 100*    HLD Crestor, Zetia ,   Hypothyroidism Continue Synthroid    Ground-level fall Mild to moderate spinal canal stenosis CT lumbar showed degenerative changes, mild to moderate spinal canal stenosis, foraminal stenosis in lumbar region. PT consulted   Mobility: Encourage ambulation.  PT eval ordered PT Orders: Active   PT Follow up Rec:    Goals of care   Code Status: Full Code     DVT prophylaxis:  SCDs Start: 12/31/23 0845   Antimicrobials: Perioperative antibiotics Fluid: None Consultants: Cardiology Family Communication: Daughter at bedside  Status: Inpatient Level of care:  Progressive   Patient is from: Home Needs to continue in-hospital care: Pending pacemaker placement today, pending PT eval Anticipated d/c to: Hopefully home tomorrow      Diet:  Diet Order             Diet NPO time specified  Diet effective now                   Scheduled Meds:  cinacalcet  30 mg Oral Q lunch   ezetimibe   10 mg Oral QHS   gentamicin (GARAMYCIN) 80 mg in sodium chloride  0.9 % 500 mL irrigation  80 mg Irrigation On Call   insulin  aspart  0-5 Units Subcutaneous QHS   insulin  aspart  0-6 Units Subcutaneous TID WC   levothyroxine   88 mcg Oral QAC  breakfast   lisinopril  20 mg Oral Daily   magnesium oxide  400 mg Oral Daily   rosuvastatin  20 mg Oral QHS    PRN meds:    Infusions:   sodium chloride      sodium chloride  10 mL/hr at 01/01/24 0752    ceFAZolin  (ANCEF ) IV      Antimicrobials: Anti-infectives (From admission, onward)    Start     Dose/Rate Route Frequency Ordered Stop   01/01/24 0800  ceFAZolin  (ANCEF ) IVPB 2g/100 mL premix        2 g 200 mL/hr over 30 Minutes Intravenous On call 01/01/24 0435 01/02/24 0800   01/01/24 0530  gentamicin (GARAMYCIN) 80 mg in sodium chloride  0.9 % 500 mL irrigation        80 mg Irrigation On call  01/01/24 0435 01/02/24 0530       Objective: Vitals:   01/01/24 0830 01/01/24 1204  BP: (!) 144/51 (!) 161/39  Pulse: (!) 34 (!) 36  Resp: 18 15  Temp: 97.8 F (36.6 C) 97.8 F (36.6 C)  SpO2: 99% 97%    Intake/Output Summary (Last 24 hours) at 01/01/2024 1332 Last data filed at 01/01/2024 0700 Gross per 24 hour  Intake 0 ml  Output --  Net 0 ml   Filed Weights   12/31/23 0634  Weight: 66 kg   Weight change:  Body mass index is 26.61 kg/m.   Physical Exam: General exam: Pleasant, elderly Caucasian female.  Not in distress Skin: No rashes, lesions or ulcers. HEENT: Atraumatic, normocephalic, no obvious bleeding Lungs: Clear to auscultation bilaterally,  CVS: Persistent bradycardia, no murmur GI/Abd: Soft, nontender, nondistended, bowel sound present,   CNS: Alert, awake, oriented x 3 Psychiatry: Mood appropriate Extremities: 1+ bilateral pedal edema, no calf tenderness,   Data Review: I have personally reviewed the laboratory data and studies available.  F/u labs ordered Unresulted Labs (From admission, onward)     Start     Ordered   01/02/24 0500  CBC with Differential/Platelet  Tomorrow morning,   R        01/01/24 1332   01/02/24 0500  Basic metabolic panel with GFR  Tomorrow morning,   R        01/01/24 1332            Signed, Chapman Rota, MD [facility] 01/01/2024

## 2024-01-01 NOTE — Progress Notes (Signed)
 PT Cancellation Note  Patient Details Name: Rhonda Singh MRN: [id number] DOB: Jul 15, 1930   Cancelled Treatment:    Reason Eval/Treat Not Completed: Patient at procedure or test/unavailable, held this morning as per Dr. Lesia, PT/OT post PPM. Pt now off unit for PPM. Will continue to follow and evaluate as time/schedule allow.   Izetta Call, PT, DPT   Acute Rehabilitation Department Office [phone] Secure Chat Communication Preferred    [name] 01/01/2024, 3:01 PM

## 2024-01-01 NOTE — Inpatient Diabetes Management (Signed)
 Inpatient Diabetes Program Recommendations  AACE/ADA: New Consensus Statement on Inpatient Glycemic Control (2015)  Target Ranges:  Prepandial:   less than 140 mg/dL      Peak postprandial:   less than 180 mg/dL (1-2 hours)      Critically ill patients:  140 - 180 mg/dL   Lab Results  Component Value Date   GLUCAP 119 (H) 01/01/2024   HGBA1C 5.6 12/31/2023    Review of Glycemic Control  Latest Reference Range & Units 12/31/23 11:37 12/31/23 15:32 12/31/23 21:36 01/01/24 08:13  Glucose-Capillary 70 - 99 mg/dL 66 (L) 94 885 (H) 880 (H)   Diabetes history: DM 2 Outpatient Diabetes medications:  None Current orders for Inpatient glycemic control:  Novolog  0-15 units tid with meals   Inpatient Diabetes Program Recommendations:    Please reduce Novolog  correction to 0-6 units tid with meals.   Thanks,  Randall Bullocks, RN, BC-ADM Inpatient Diabetes Coordinator Pager [phone]  (8a-5p)

## 2024-01-01 NOTE — Op Note (Signed)
 Procedure:  LEFT sided MDT Dual Chamber PPM Implant  Pre-op Diagnosis: CHB, sx bradycardia    Post-op Diagnosis: same  Procedure Date: 01/01/24  Attending: Adina Primus, MD   Anesthesia: MAC  Procedure Report:  The LEFT shoulder was prepped with chlorhexidine  scrub. 30 cc lidocaine  was injected at the planned incision site. A pocket was created with electrocautery and blunt dissection.    No venogram was performed. The LEFT axillary vein was accessed using standard access needle under ultrasound guidance. A wire was passed into the IVC. The access process was repeated once and a second wire was advanced into the IVC.  A 7 Fr sheath was placed over the first wire. The RA lead was subsequently advanced over a curved stylet into the RV for temporary backup pacing.  A 7 Fr sheath was advanced and the dilator removed. Through this short sheath, a C315 fixed curve sheath was advanced to the basal RV septum.   The RV lead was inserted into the sheath and the lead was then advanced to the basal RV septum.   The site for deployment was identified based on RAO and LAO fluoroscopy and response to pacing. With RV endocardial pacing, we observed a W pattern in V1 and aVR/aVL discordance. The body of the 3830 lead was rotated in a clockwise fashion and it was advanced into the RV septum.   The paced morphology demonstrated qR in V1.   The LVAT was <80 ms as measured  from onset of stim to peak R wave in V6. The above findings were most consistent with selective LB capture  The sheath was split and removed and the RV lead was firmly attached to the pre-pectoralis fascia with three 0-silk sutures.   The RA lead was subsequently withdrawn from the RV. A curved stylet was then used to affix the RA lead in the right atrial appendage. The active fixation screw was deployed, and interrogation revealed a current of injury with acceptable lead parameters (see below). The peel away sheath was removed  and the lead was affixed to the fascia using two 0-silk sutures.   The device was then connected to the leads. The pocket was irrigated with gentamicin solution. The pocket was closed with continuous 2-0 V-Loc and 3-0 V-Loc. Dermabond was used to close the superficial layer. A vaso-occlusive dressing was placed.    The device was evaluated by the representative of the cardiac device manufacturer under the supervision of the attending physician with implant parameters listed below.   Complications: none Estimated blood loss: less than 50 mL  Implanted Hardware: Implant Name Type Inv. Item Serial No. Manufacturer Lot No. LRB No. Used Action  LEAD CAPSURE NOVUS 5076-52CM - [id number] Lead LEAD CAPSURE NOVUS 5076-52CM [id number] [employer]  N/A 1 Implanted  LEAD SELECT SECURE 3830 [id number] - [id number] Lead LEAD SELECT SECURE 3830 [id number] [employer]  N/A 1 Implanted  IPG PACE AZUR XT DR MRI T8IM98 - [id number] Pacemaker IPG PACE AZUR XT DR MRI T8IM98 [id number] [employer]  N/A 1 Implanted   Device Information: PPM: MDT Azure XT DR MRI [id number], SN [id number], DOI 01/01/24 RA:  MDT 5076 CapsureFix Novus MRI, SN [id number], DOI 01/01/24 RV/LBaP: MDT 3830 SelectSecure, SN [id number], DOI 01/01/24  Lead Interrogation Data: RA: 2.1 mV / 437 ohms / 0.75 V @ 0.4 ms RV/LBaP: none (CHB)/ 646 ohms / 0.5 V @ 0.4 ms  Summary: 1. Successful implant of a LEFT sided MDT DC/LBBAP PPM with  selective LB capture  Recommendations: Routine post-procedure care with bedrest for 3 hours No heparin (IV or subcutaneous) for 48 hours. No enoxaparin  (IV or subcutaneous) for 7 days.  PA/lateral CXR in AM Wound check in AM Device interrogation in AM  Donnice DELENA Primus, MD [facility]  Cardiac Electrophysiology

## 2024-01-01 NOTE — Plan of Care (Signed)
  Problem: Education: Goal: Ability to describe self-care measures that may prevent or decrease complications (Diabetes Survival Skills Education) will improve Outcome: Progressing Goal: Individualized Educational Video(s) Outcome: Progressing   Problem: Coping: Goal: Ability to adjust to condition or change in health will improve Outcome: Progressing   Problem: Fluid Volume: Goal: Ability to maintain a balanced intake and output will improve Outcome: Progressing   Problem: Health Behavior/Discharge Planning: Goal: Ability to identify and utilize available resources and services will improve Outcome: Progressing Goal: Ability to manage health-related needs will improve Outcome: Progressing   Problem: Metabolic: Goal: Ability to maintain appropriate glucose levels will improve Outcome: Progressing   Problem: Nutritional: Goal: Maintenance of adequate nutrition will improve Outcome: Progressing Goal: Progress toward achieving an optimal weight will improve Outcome: Progressing   Problem: Skin Integrity: Goal: Risk for impaired skin integrity will decrease Outcome: Progressing   Problem: Tissue Perfusion: Goal: Adequacy of tissue perfusion will improve Outcome: Progressing   Problem: Education: Goal: Knowledge of General Education information will improve Description: Including pain rating scale, medication(s)/side effects and non-pharmacologic comfort measures Outcome: Progressing   Problem: Health Behavior/Discharge Planning: Goal: Ability to manage health-related needs will improve Outcome: Progressing   Problem: Clinical Measurements: Goal: Ability to maintain clinical measurements within normal limits will improve Outcome: Progressing Goal: Will remain free from infection Outcome: Progressing Goal: Diagnostic test results will improve Outcome: Progressing Goal: Respiratory complications will improve Outcome: Progressing Goal: Cardiovascular complication will  be avoided Outcome: Progressing   Problem: Activity: Goal: Risk for activity intolerance will decrease Outcome: Progressing   Problem: Nutrition: Goal: Adequate nutrition will be maintained Outcome: Progressing   Problem: Coping: Goal: Level of anxiety will decrease Outcome: Progressing   Problem: Elimination: Goal: Will not experience complications related to bowel motility Outcome: Progressing Goal: Will not experience complications related to urinary retention Outcome: Progressing   Problem: Pain Managment: Goal: General experience of comfort will improve and/or be controlled Outcome: Progressing   Problem: Safety: Goal: Ability to remain free from injury will improve Outcome: Progressing   Problem: Skin Integrity: Goal: Risk for impaired skin integrity will decrease Outcome: Progressing   Problem: Education: Goal: Knowledge of cardiac device and self-care will improve Outcome: Progressing Goal: Ability to safely manage health related needs after discharge will improve Outcome: Progressing Goal: Individualized Educational Video(s) Outcome: Progressing   Problem: Cardiac: Goal: Ability to achieve and maintain adequate cardiopulmonary perfusion will improve Outcome: Progressing

## 2024-01-01 NOTE — Progress Notes (Signed)
  Patient Name: Rhonda Singh Date of Encounter: 01/01/2024  Primary Cardiologist: None Electrophysiologist: None  Interval Summary   Remains in CHB. No acute events overnight. No new symptoms.   Vital Signs    Vitals:   12/31/23 2200 12/31/23 2300 01/01/24 0020 01/01/24 0420  BP:   (!) 130/38 (!) 141/85  Pulse:   (!) 43 (!) 35  Resp: 18 18 16 15   Temp:   97.9 F (36.6 C)   TempSrc:   Axillary   SpO2:   99% 99%  Weight:      Height:       No intake or output data in the 24 hours ending 01/01/24 0731 Filed Weights   12/31/23 0634  Weight: 66 kg    Physical Exam    GEN- NAD, Alert and oriented  Lungs- Clear to ausculation bilaterally, normal work of breathing Cardiac- Slow but regular rate and rhythm, no murmurs, rubs or gallops GI- soft, NT, ND, + BS Extremities- no clubbing or cyanosis. No edema  Telemetry    CHB 30-40s (personally reviewed)  Hospital Course    Rhonda Singh is a 89 y.o. female with a history of HTN, DM2, and hypothyroidism who is being seen today for the evaluation of heart block at the request of Dr. Bernard.   Assessment & Plan    CHB Reports HR in 40s over the last several days, with weakness x 1 week No AV nodal agents on board. Echo shows normal EF Explained risks, benefits, and alternatives to PPM implantation, including but not limited to bleeding, infection, pneumothorax, pericardial effusion, lead dislodgement, heart attack, stroke, or death.  Pt verbalized understanding and agrees to proceed.    Not on OAC    Peripheral edema Echo with normal EF Able to lie flat   HTN DM2 LE pain s/p fall Deconditioning Per IM PT/OT post PPM  Dr. Almetta has seen. On for PPM this afternoon.   For questions or updates, please contact [facility] Please consult [email or web address] for contact info under     Signed, Ozell Prentice Passey, PA-C  01/01/2024, 7:31 AM

## 2024-01-02 ENCOUNTER — Inpatient Hospital Stay (HOSPITAL_COMMUNITY)

## 2024-01-02 ENCOUNTER — Encounter (HOSPITAL_COMMUNITY): Payer: Self-pay | Admitting: Student in an Organized Health Care Education/Training Program

## 2024-01-02 ENCOUNTER — Other Ambulatory Visit (HOSPITAL_COMMUNITY): Payer: Self-pay

## 2024-01-02 DIAGNOSIS — I442 Atrioventricular block, complete: Secondary | ICD-10-CM | POA: Diagnosis not present

## 2024-01-02 LAB — CBC WITH DIFFERENTIAL/PLATELET
Abs Immature Granulocytes: 0.01 K/uL (ref 0.00–0.07)
Basophils Absolute: 0 K/uL (ref 0.0–0.1)
Basophils Relative: 1 %
Eosinophils Absolute: 0.1 K/uL (ref 0.0–0.5)
Eosinophils Relative: 3 %
HCT: 31.9 % — ABNORMAL LOW (ref 36.0–46.0)
Hemoglobin: 10.6 g/dL — ABNORMAL LOW (ref 12.0–15.0)
Immature Granulocytes: 0 %
Lymphocytes Relative: 26 %
Lymphs Abs: 1.2 K/uL (ref 0.7–4.0)
MCH: 32 pg (ref 26.0–34.0)
MCHC: 33.2 g/dL (ref 30.0–36.0)
MCV: 96.4 fL (ref 80.0–100.0)
Monocytes Absolute: 0.5 K/uL (ref 0.1–1.0)
Monocytes Relative: 10 %
Neutro Abs: 2.7 K/uL (ref 1.7–7.7)
Neutrophils Relative %: 60 %
Platelets: 147 K/uL — ABNORMAL LOW (ref 150–400)
RBC: 3.31 MIL/uL — ABNORMAL LOW (ref 3.87–5.11)
RDW: 14.1 % (ref 11.5–15.5)
WBC: 4.5 K/uL (ref 4.0–10.5)
nRBC: 0 % (ref 0.0–0.2)

## 2024-01-02 LAB — BASIC METABOLIC PANEL WITH GFR
Anion gap: 11 (ref 5–15)
BUN: 27 mg/dL — ABNORMAL HIGH (ref 8–23)
CO2: 20 mmol/L — ABNORMAL LOW (ref 22–32)
Calcium: 9 mg/dL (ref 8.9–10.3)
Chloride: 113 mmol/L — ABNORMAL HIGH (ref 98–111)
Creatinine, Ser: 1.22 mg/dL — ABNORMAL HIGH (ref 0.44–1.00)
GFR, Estimated: 42 mL/min — ABNORMAL LOW (ref >60)
Glucose, Bld: 98 mg/dL (ref 70–99)
Potassium: 3.4 mmol/L — ABNORMAL LOW (ref 3.5–5.1)
Sodium: 144 mmol/L (ref 135–145)

## 2024-01-02 LAB — GLUCOSE, CAPILLARY
Glucose-Capillary: 93 mg/dL (ref 70–99)
Glucose-Capillary: 155 mg/dL — ABNORMAL HIGH (ref 70–99)

## 2024-01-02 MED ORDER — FUROSEMIDE 20 MG PO TABS
20.0000 mg | ORAL_TABLET | Freq: Every day | ORAL | 0 refills | Status: AC | PRN
  Filled 2024-01-02: qty 30, 30d supply, fill #0

## 2024-01-02 MED ORDER — ACETAMINOPHEN 325 MG PO TABS: 325.0000 mg | ORAL_TABLET | ORAL | Status: AC | PRN

## 2024-01-02 NOTE — Evaluation (Signed)
 Physical Therapy Evaluation Patient Details Name: Rhonda Singh MRN: [id number] DOB: February 06, 1931 Today's Date: 01/02/2024  History of Present Illness  89 yo female adm 12/31/23 after fall, complete heart block. 10/29 PPM implant. PMHx: DM II, HTN, thyroid  disease.  Clinical Impression  Pt very pleasant, eager to move and gain strength to return home. Pt normally lives alone, uses cane and cares for herself with only the one fall PTA. Pt educated for PPM precautions, transfers, gait and function who benefits from RW currently for stability as well as assist for stairs. Pt with decreased strength, activity tolerance and gait who will benefit from acute therapy to maximize mobility and family assist at D/C.       If plan is discharge home, recommend the following: A little help with walking and/or transfers;A little help with bathing/dressing/bathroom;Assist for transportation;Help with stairs or ramp for entrance   Can travel by private vehicle        Equipment Recommendations Rolling walker (2 wheels)  Recommendations for Other Services       Functional Status Assessment Patient has had a recent decline in their functional status and demonstrates the ability to make significant improvements in function in a reasonable and predictable amount of time.     Precautions / Restrictions Precautions Precautions: Fall;ICD/Pacemaker Recall of Precautions/Restrictions: Impaired      Mobility  Bed Mobility               General bed mobility comments: pt sititng EOB upon arrival    Transfers Overall transfer level: Needs assistance   Transfers: Sit to/from Stand Sit to Stand: Contact guard assist           General transfer comment: CGA from bed and chair with cues for precautions and safety    Ambulation/Gait Ambulation/Gait assistance: Supervision Gait Distance (Feet): 140 Feet Assistive device: Rolling walker (2 wheels) Gait Pattern/deviations: Step-through pattern,  Decreased stride length, Wide base of support   Gait velocity interpretation: 1.31 - 2.62 ft/sec, indicative of limited community ambulator   General Gait Details: pt initially walking 21' with hand held assist to simulate cane with wide BOS and increased sway. Transitioned to RW use with increased stability and stamina with pt walking 140' with HR 64-114, 96% on RA  Stairs Stairs: Yes Stairs assistance: Min assist Stair Management: Step to pattern, Forwards, One rail Right Number of Stairs: 5 General stair comments: pt with reliance on rail and normally pulls up with both arms on rail, required Rt rail and min assist on left to help boost to maintain precautions  Wheelchair Mobility     Tilt Bed    Modified Rankin (Stroke Patients Only)       Balance Overall balance assessment: Needs assistance Sitting-balance support: No upper extremity supported, Feet supported Sitting balance-Leahy Scale: Good     Standing balance support: Bilateral upper extremity supported, Single extremity supported, During functional activity Standing balance-Leahy Scale: Poor Standing balance comment: UB support in standing                             Pertinent Vitals/Pain Pain Assessment Pain Assessment: No/denies pain    Home Living Family/patient expects to be discharged to:: Private residence Living Arrangements: Alone Available Help at Discharge: Family;Available 24 hours/day Type of Home: House Home Access: Stairs to enter Entrance Stairs-Rails: Doctor, General Practice of Steps: 5   Home Layout: One level Home Equipment: Shower seat;Cane - single point  Prior Function Prior Level of Function : Independent/Modified Independent;Driving             Mobility Comments: uses cane ADLs Comments: Ind with ADL/selfcare     Extremity/Trunk Assessment   Upper Extremity Assessment Upper Extremity Assessment: Defer to OT evaluation LUE Deficits / Details:  pacemaker precautions    Lower Extremity Assessment Lower Extremity Assessment: Generalized weakness    Cervical / Trunk Assessment Cervical / Trunk Assessment: Normal  Communication   Communication Communication: No apparent difficulties    Cognition Arousal: Alert Behavior During Therapy: WFL for tasks assessed/performed   PT - Cognitive impairments: Memory                       PT - Cognition Comments: decreased recall and implementation of precautions Following commands: Intact       Cueing Cueing Techniques: Verbal cues     General Comments      Exercises     Assessment/Plan    PT Assessment Patient needs continued PT services  PT Problem List Decreased balance;Decreased mobility;Decreased knowledge of use of DME;Decreased strength;Decreased activity tolerance       PT Treatment Interventions DME instruction;Gait training;Stair training;Functional mobility training;Therapeutic activities;Patient/family education;Therapeutic exercise    PT Goals (Current goals can be found in the Care Plan section)  Acute Rehab PT Goals Patient Stated Goal: return home PT Goal Formulation: With patient/family Time For Goal Achievement: 01/16/24 Potential to Achieve Goals: Good    Frequency Min 2X/week     Co-evaluation               AM-PAC PT 6 Clicks Mobility  Outcome Measure Help needed turning from your back to your side while in a flat bed without using bedrails?: A Little Help needed moving from lying on your back to sitting on the side of a flat bed without using bedrails?: A Little Help needed moving to and from a bed to a chair (including a wheelchair)?: A Little Help needed standing up from a chair using your arms (e.g., wheelchair or bedside chair)?: A Little Help needed to walk in hospital room?: A Little Help needed climbing 3-5 steps with a railing? : A Little 6 Click Score: 18    End of Session Equipment Utilized During Treatment:  Gait belt Activity Tolerance: Patient tolerated treatment well Patient left: in chair;with call bell/phone within reach;with family/visitor present Nurse Communication: Mobility status PT Visit Diagnosis: Other abnormalities of gait and mobility (R26.89);Difficulty in walking, not elsewhere classified (R26.2)    Time: 8865-8789 PT Time Calculation (min) (ACUTE ONLY): 36 min   Charges:   PT Evaluation $PT Eval Moderate Complexity: 1 Mod PT Treatments $Gait Training: 8-22 mins PT General Charges $$ ACUTE PT VISIT: 1 Visit         Lenoard SQUIBB, PT Acute Rehabilitation Services Office: [phone]   Ecko Beasley B Srihari Shellhammer 01/02/2024, 1:12 PM

## 2024-01-02 NOTE — Discharge Instructions (Signed)
 After Your Pacemaker   You have a Medtronic Pacemaker  If you have a Medtronic or Biotronik device, plug in your home monitor once you get home, and no manual interaction is required.   If you have an Abbott or Aut[employer]one device, plug your home monitor once you get home, sit near the device, and press the large activation button. Sit nearby until the process is complete, usually notated by lights on the monitor.   If you were set up for monitoring using an app on your phone, make sure the app remains open in the background and the Bluetooth remains on.  ACTIVITY Do not lift your arm above shoulder height for 1 week after your procedure. After 7 days, you may progress as below.  You should remove your sling 24 hours after your procedure, unless otherwise instructed by your provider.     Thursday January 09, 2024  Friday January 10, 2024 Saturday January 11, 2024 Sunday January 12, 2024   Do not lift, push, pull, or carry anything over 10 pounds with the affected arm until 6 weeks (Thursday February 13, 2024 ) after your procedure.   You may drive AFTER your wound check, unless you have been told otherwise by your provider.   Ask your healthcare provider when you can go back to work   INCISION/Dressing If you are on a blood thinner such as Coumadin, Xarelto, Eliquis, Plavix, or Pradaxa please confirm with your provider when this should be resumed.   If large square, outer bandage is left in place, this can be removed after 24 hours from your procedure. Do not remove steri-strips or glue as below.   If a PRESSURE DRESSING (a bulky dressing that usually goes up over your shoulder) was applied or left in place, please follow instructions given by your provider on when to return to have this removed.   Monitor your Pacemaker site for redness, swelling, and drainage. Call the device clinic at [phone] if you experience these symptoms or fever/chills.  If your incision was closed  with Dermabond, you may shower 24 hours after your procedure. Do not submerge underwater until after your wound check.   If you were discharged in a sling, please do not wear this during the day more than 48 hours after your surgery unless otherwise instructed. This may increase the risk of stiffness and soreness in your shoulder.   Avoid lotions, ointments, or perfumes over your incision until it is well-healed.  You may use a hot tub or a pool AFTER your wound check appointment if the incision is completely closed.  Pacemaker Alerts:  Some alerts are vibratory and others beep. These are NOT emergencies. Please call our office to let us  know. If this occurs at night or on weekends, it can wait until the next business day. Send a remote transmission.  If your device is capable of reading fluid status (for heart failure), you will be offered monthly monitoring to review this with you.   DEVICE MANAGEMENT Remote monitoring is used to monitor your pacemaker from home. This monitoring is scheduled every 91 days by our office. It allows us  to keep an eye on the functioning of your device to ensure it is working properly. You will routinely see your Electrophysiologist annually (more often if necessary).  This will appear as a REMOTE check on your MyChart schedule. These are automatic and there is nothing for you to manually do unless otherwise instructed.  You should receive your ID card  for your new device in 4-8 weeks. Keep this card with you at all times once received. Consider wearing a medical alert bracelet or necklace.  Your Pacemaker may be MRI compatible. This will be discussed at your next office visit/wound check.  You should avoid contact with strong electric or magnetic fields.   Do not use amateur (ham) radio equipment or electric (arc) welding torches. MP3 player headphones with magnets should not be used. Some devices are safe to use if held at least 12 inches (30 cm) from your  Pacemaker. These include power tools, lawn mowers, and speakers. If you are unsure if something is safe to use, ask your health care provider.  When using your cell phone, hold it to the ear that is on the opposite side from the Pacemaker. Do not leave your cell phone in a pocket over the Pacemaker.  You may safely use electric blankets, heating pads, computers, and microwave ovens.  Call the office right away if: You have chest pain. You feel more short of breath than you have felt before. You feel more light-headed than you have felt before. Your incision starts to open up.  This information is not intended to replace advice given to you by your health care provider. Make sure you discuss any questions you have with your health care provider.

## 2024-01-02 NOTE — Progress Notes (Signed)
  Patient Name: Rhonda Singh Date of Encounter: 01/02/2024  Primary Cardiologist: None Electrophysiologist: None  Interval Summary   No new complaints. NAEO.  Vital Signs    Vitals:   01/01/24 1605 01/01/24 1949 01/01/24 2335 01/02/24 0350  BP: (!) 138/99 132/73 (!) 138/53 (!) 155/58  Pulse: 81 70 60 63  Resp: (!) 22 18 16 16   Temp:  98.5 F (36.9 C) 98.2 F (36.8 C) 98.4 F (36.9 C)  TempSrc:  Oral Oral Oral  SpO2: 95% 97% 96% 96%  Weight:      Height:        Intake/Output Summary (Last 24 hours) at 01/02/2024 0706 Last data filed at 01/01/2024 1712 Gross per 24 hour  Intake 240.16 ml  Output --  Net 240.16 ml   Filed Weights   12/31/23 0634  Weight: 66 kg    Physical Exam    GEN- NAD, Alert and oriented  Lungs- Clear to ausculation bilaterally, normal work of breathing Cardiac- Regular rate and rhythm, no murmurs, rubs or gallops GI- soft, NT, ND, + BS Extremities- no clubbing or cyanosis. No edema  Telemetry    NSR AS, V pacing 83 (personally reviewed)  Hospital Course    Rhonda Singh is a 89 y.o. female with a history of HTN, DM2, and hypothyroidism who is being seen today for the evaluation of heart block at the request of Dr. Bernard.   Assessment & Plan    CHB S/p Medtronic dual chamber PPM CXR stable Interrogation stable. Wound care and restrictions with patient. Will review with family on their arrival.   Usual follow up in place   EP to see as needed while remains here.   For questions or updates, please contact [facility] Please consult [email or web address] for contact info under     Signed, Ozell Prentice Passey, PA-C  01/02/2024, 7:06 AM

## 2024-01-02 NOTE — Discharge Summary (Signed)
 Physician Discharge Summary  Rhonda Singh FMW:[id number] DOB: 1930-12-19 DOA: 12/31/2023  PCP: [facility]  Admit date: 12/31/2023 Discharge date: 01/02/2024  Admitted from: Home Discharge disposition: Home  Recommendations at discharge:  Pacemaker ADA wound care and restrictions per EP recommendation You been started on Lasix 20 mg to be used as needed for pedal edema Improve the use of compression stockings   Subjective: Patient was seen and examined this morning.   Underwent dual-chamber pacemaker placement yesterday Pleasant elderly Caucasian female.  Worked with PT this morning.  Sitting up in recliner.  Not in distress.  Family at bedside. Feels good enough to go home today. Afebrile, heart rate in 60s to 80s, blood pressure fluctuating from 130s to 170s Labs from this morning with potassium 3.4, BUN/creatinine 27/1.22, hemoglobin 10.6  Brief narrative: Rhonda Singh is a 89 y.o. female with PMH significant for DM2, HTN, hypothyroidism 10/28, patient was brought to the ED by EMS from home after an unwitnessed fall.  Patient reported falling to the floor while attempting to get on the commode in the morning around 3 AM.  Her leg gave out and she could not get back up.  It was about an hour before she could get any help.  Did not pass out.  In the ED, patient was bradycardic to 30s and hypertensive to 170s Initial labs with BNP over 1000, BUN/creatinine 28/1.38, serum bicarb low at 16 EKG showed third-degree heart block Skeletal survey without any acute fracture EDP consulted cardiology Admitted to [facility] course: Complete heart block Heart rate in 30s and 40s.  EKG at presentation showed complete heart block.   Not on any AV nodal blocking agent. Echo with normal EF 10/29, underwent dual-chamber pacemaker placed by EP. Per EP note, interrogation this morning is stable.  EP discussed with patient about restrictions and wound  care.   Acute exacerbation of CHF Hypertension Had bilateral pedal edema, elevated BNP  Echo with EF 70 to 75%, hyperdynamic function, mild to moderate MR, TR -secondary bradycardia Given 3 doses of IV Lasix so far.  I would encourage ace wrapping of both legs PTA meds- lisinopril 20 mg daily Currently continued on the same.  I would add Lasix as needed at discharge   Type 2 diabetes mellitus Hypoglycemia A1c 5.6 on 12/31/2023  PTA meds-metformin 1000 mg twice daily Continue the same. Recent Labs  Lab 01/01/24 1203 01/01/24 1731 01/01/24 2123 01/02/24 1010 01/02/24 1150  GLUCAP 100* 127* 110* 93 155*   HLD Crestor, Zetia ,   Hypothyroidism Continue Synthroid    Ground-level fall Impaired mobility Mild to moderate spinal canal stenosis CT lumbar showed degenerative changes, mild to moderate spinal canal stenosis, foraminal stenosis in lumbar region. Seen by PT. No PT follow-up recommended  Goals of care   Code Status: Full Code   Diet:  Diet Order             Diet general           Diet regular Room service appropriate? Yes; Fluid consistency: Thin  Diet effective now                   Nutritional status:  Body mass index is 26.61 kg/m.       Wounds:  - Wound 01/01/24 1549 Surgical Closed Surgical Incision Sternum Left;Upper (Active)  Date First Assessed/Time First Assessed: 01/01/24 1549   Primary Wound Type: Surgical  Secondary Wound Type - Surgical: Closed Surgical Incision  Location: Sternum  Location Orientation: Left;Upper    Assessments 01/01/2024  3:49 PM 01/02/2024  7:20 AM  Site / Wound Assessment Clean;Dry Clean;Dry  Dressing Type Gauze (Comment) Gauze (Comment)  Dressing Status Clean, Dry, Intact Clean, Dry, Intact  Margins Attached edges (approximated) Attached edges (approximated)     No associated orders.    Discharge Medications:   Allergies as of 01/02/2024   No Known Allergies      Medication List     STOP taking  these medications    naproxen sodium 220 MG tablet Commonly known as: ALEVE       TAKE these medications    acetaminophen  325 MG tablet Commonly known as: TYLENOL  Take 1-2 tablets (325-650 mg total) by mouth every 4 (four) hours as needed for mild pain (pain score 1-3).   aspirin  EC 81 MG tablet Take 81 mg by mouth at bedtime.   Cholecalciferol 50 MCG (2000 UT) Caps Take 2,000 Units by mouth in the morning and at bedtime.   cinacalcet 30 MG tablet Commonly known as: SENSIPAR Take 30 mg by mouth daily.   cyanocobalamin 1000 MCG tablet Commonly known as: VITAMIN B12 Take 1,000 mcg by mouth daily.   ezetimibe  10 MG tablet Commonly known as: ZETIA  Take 10 mg by mouth at bedtime.   furosemide 20 MG tablet Commonly known as: Lasix Take 1 tablet (20 mg total) by mouth daily as needed for edema.   levothyroxine  88 MCG tablet Commonly known as: SYNTHROID  Take 88 mcg by mouth daily before breakfast.   lisinopril 20 MG tablet Commonly known as: ZESTRIL Take 1 tablet by mouth daily.   magnesium oxide 400 MG tablet Commonly known as: MAG-OX Take 400 mg by mouth daily.   METFORMIN HCL PO Take 1,000 mg by mouth in the morning and at bedtime.   rosuvastatin 20 MG tablet Commonly known as: CRESTOR Take 20 mg by mouth at bedtime.               Durable Medical Equipment  (From admission, onward)           Start     Ordered   01/02/24 1218  For home use only DME Walker rolling  Once       Question Answer Comment  Walker: With 5 Inch Wheels   Patient needs a walker to treat with the following condition General weakness      01/02/24 1217              Discharge Care Instructions  (From admission, onward)           Start     Ordered   01/02/24 0000  Discharge wound care:        01/02/24 1404             Follow ups:    Follow-up Information     [facility] Follow up.   Specialty: Family Medicine Contact  information: [address] [place] [address] [phone]                 Discharge Instructions:   Discharge Instructions     Call MD for:  difficulty breathing, headache or visual disturbances   Complete by: As directed    Call MD for:  extreme fatigue   Complete by: As directed    Call MD for:  hives   Complete by: As directed    Call MD for:  persistant dizziness or light-headedness  Complete by: As directed    Call MD for:  persistant nausea and vomiting   Complete by: As directed    Call MD for:  severe uncontrolled pain   Complete by: As directed    Call MD for:  temperature >100.4   Complete by: As directed    Diet general   Complete by: As directed    Discharge instructions   Complete by: As directed    Recommendations at discharge:   Pacemaker ADA wound care and restrictions per EP recommendation  You been started on Lasix 20 mg to be used as needed for pedal edema  Improve the use of compression stockings  General discharge instructions: Follow with Primary MD [facility] in 7 days  Please request your PCP  to go over your hospital tests, procedures, radiology results at the follow up. Please get your medicines reviewed and adjusted.  Your PCP may decide to repeat certain labs or tests as needed. Do not drive, operate heavy machinery, perform activities at heights, swimming or participation in water activities or provide baby sitting services if your were admitted for syncope or siezures until you have seen by Primary MD or a Neurologist and advised to do so again.   Controlled Substance Reporting System database was reviewed. Do not drive, operate heavy machinery, perform activities at heights, swim, participate in water activities or provide baby-sitting services while on medications for pain, sleep and mood until your outpatient physician has reevaluated you and advised to do so again.  You are  strongly recommended to comply with the dose, frequency and duration of prescribed medications. Activity: As tolerated with Full fall precautions use walker/cane & assistance as needed Avoid using any recreational substances like cigarette, tobacco, alcohol, or non-prescribed drug. If you experience worsening of your admission symptoms, develop shortness of breath, life threatening emergency, suicidal or homicidal thoughts you must seek medical attention immediately by calling 911 or calling your MD immediately  if symptoms less severe. You must read complete instructions/literature along with all the possible adverse reactions/side effects for all the medicines you take and that have been prescribed to you. Take any new medicine only after you have completely understood and accepted all the possible adverse reactions/side effects.  Wear Seat belts while driving. You were cared for by a hospitalist during your hospital stay. If you have any questions about your discharge medications or the care you received while you were in the hospital after you are discharged, you can call the unit and ask to speak with the hospitalist or the covering physician. Once you are discharged, your primary care physician will handle any further medical issues. Please note that NO REFILLS for any discharge medications will be authorized once you are discharged, as it is imperative that you return to your primary care physician (or establish a relationship with a primary care physician if you do not have one).   Discharge wound care:   Complete by: As directed    Increase activity slowly   Complete by: As directed        Discharge Exam:   Vitals:   01/02/24 0749 01/02/24 1021 01/02/24 1200 01/02/24 1307  BP: (!) 172/71 (!) 169/68 (!) 167/60   Pulse: 80 81 97 (!) 114  Resp: 20 19    Temp: (!) 97.5 F (36.4 C)  98 F (36.7 C)   TempSrc: Oral  Oral   SpO2: 98% 98% 96%   Weight:  Height:        Body mass  index is 26.61 kg/m.   General exam: Pleasant, elderly Caucasian female.  Not in distress Skin: No rashes, lesions or ulcers. HEENT: Atraumatic, normocephalic, no obvious bleeding Lungs: Clear to auscultation bilaterally,  CVS: Regular rate and rhythm, no murmur GI/Abd: Soft, nontender, nondistended, bowel sound present,   CNS: Alert, awake, oriented x 3 Psychiatry: Mood appropriate Extremities: 1+ bilateral pedal edema, no calf tenderness,    The results of significant diagnostics from this hospitalization (including imaging, microbiology, ancillary and laboratory) are listed below for reference.    Procedures and Diagnostic Studies:   EP PPM/ICD IMPLANT Result Date: 01/01/2024 Summary: 1. Successful implant of a LEFT sided MDT DC/LBBAP PPM with selective LB capture  Recommendations: 1. Routine post-procedure care with bedrest for 3 hours 2. No heparin (IV or subcutaneous) for 48 hours. No enoxaparin  (IV or subcutaneous) for 7 days. 3. PA/lateral CXR in AM 4. Wound check in AM 5. Device interrogation in AM  Donnice DELENA Primus, MD [facility] Cardiac Electrophysiology   ECHOCARDIOGRAM COMPLETE Result Date: 12/31/2023    ECHOCARDIOGRAM REPORT   Patient Name:   Rhonda Singh Date of Exam: 12/31/2023 Medical Rec #:  [id number]         Height:       62.0 in Accession #:    [id number]        Weight:       145.5 lb Date of Birth:  September 18, 1930          BSA:          1.670 m Patient Age:    89 years          BP:           170/43 mmHg Patient Gender: F                 HR:           36 bpm. Exam Location:  Inpatient Procedure: 2D Echo (Both Spectral and Color Flow Doppler were utilized during            procedure).                               MODIFIED REPORT: This report was modified by Stanly Leavens MD on 12/31/2023 due to RAP.  Indications:     Heartblock  History:         Patient has no prior history of Echocardiogram examinations.                  Arrythmias:Bradycardia.   Sonographer:     Norleen Amour Referring Phys:  OZELL PRENTICE PASSEY Diagnosing Phys: Stanly Leavens MD IMPRESSIONS  1. Left ventricular ejection fraction, by estimation, is 70 to 75%. The left ventricle has hyperdynamic function. The left ventricle has no regional wall motion abnormalities. Left ventricular diastolic parameters are indeterminate.  2. Right ventricular systolic function is normal. The right ventricular size is normal.  3. Calcified chords; diastolic mitral regurgitation seen in heart block. The mitral valve is degenerative. Mild to moderate mitral valve regurgitation. No evidence of mitral stenosis.  4. Diastolic tricuspid regurgitation with heart block.  5. The aortic valve is calcified. Aortic valve regurgitation is not visualized. Mild aortic valve stenosis.  6. The inferior vena cava is normal in size with <50% respiratory variability, suggesting right atrial pressure of 8 mmHg. Comparison(s): No prior  Echocardiogram. Conclusion(s)/Recommendation(s): Heart block and care reviewed with Jodie Passey PA-C. FINDINGS  Left Ventricle: Left ventricular ejection fraction, by estimation, is 70 to 75%. The left ventricle has hyperdynamic function. The left ventricle has no regional wall motion abnormalities. The left ventricular internal cavity size was normal in size. There is no left ventricular hypertrophy. Left ventricular diastolic parameters are indeterminate. Right Ventricle: The right ventricular size is normal. No increase in right ventricular wall thickness. Right ventricular systolic function is normal. Left Atrium: Left atrial size was normal in size. Right Atrium: Right atrial size was normal in size. Pericardium: There is no evidence of pericardial effusion. Mitral Valve: Calcified chords; diastolic mitral regurgitation seen in heart block. The mitral valve is degenerative in appearance. Mild to moderate mitral valve regurgitation. No evidence of mitral valve stenosis. MV peak  gradient, 10.2 mmHg. The mean mitral valve gradient is 2.0 mmHg. Tricuspid Valve: Diastolic tricuspid regurgitation with heart block. The tricuspid valve is normal in structure. Tricuspid valve regurgitation is mild . No evidence of tricuspid stenosis. Aortic Valve: The aortic valve is calcified. Aortic valve regurgitation is not visualized. Mild aortic stenosis is present. Aortic valve mean gradient measures 8.0 mmHg. Aortic valve peak gradient measures 14.4 mmHg. Aortic valve area, by VTI measures 0.97 cm. Pulmonic Valve: The pulmonic valve was normal in structure. Pulmonic valve regurgitation is not visualized. Aorta: The aortic root and ascending aorta are structurally normal, with no evidence of dilitation. Venous: The inferior vena cava is normal in size with less than 50% respiratory variability, suggesting right atrial pressure of 8 mmHg. IAS/Shunts: The atrial septum is grossly normal.  LEFT VENTRICLE PLAX 2D LVIDd:         4.40 cm     Diastology LVIDs:         2.10 cm     LV e' medial:    16.50 cm/s LV PW:         0.80 cm     LV E/e' medial:  5.2 LV IVS:        0.70 cm     LV e' lateral:   6.96 cm/s LVOT diam:     1.60 cm     LV E/e' lateral: 12.3 LV SV:         42 LV SV Index:   25 LVOT Area:     2.01 cm  LV Volumes (MOD) LV vol d, MOD A2C: 58.6 ml LV vol d, MOD A4C: 76.5 ml LV vol s, MOD A2C: 13.3 ml LV vol s, MOD A4C: 15.7 ml LV SV MOD A2C:     45.3 ml LV SV MOD A4C:     76.5 ml LV SV MOD BP:      52.9 ml RIGHT VENTRICLE             IVC RV Basal diam:  3.00 cm     IVC diam: 1.80 cm RV S prime:     17.00 cm/s TAPSE (M-mode): 3.1 cm      PULMONARY VEINS                             Diastolic Velocity: 78.60 cm/s                             S/D Velocity:       1.30  Systolic Velocity:  100.00 cm/s LEFT ATRIUM           Index        RIGHT ATRIUM           Index LA diam:      3.50 cm 2.10 cm/m   RA Area:     10.90 cm LA Vol (A2C): 30.5 ml 18.26 ml/m  RA Volume:   26.10 ml   15.63 ml/m LA Vol (A4C): 38.8 ml 23.23 ml/m  AORTIC VALVE                     PULMONIC VALVE AV Area (Vmax):    1.20 cm      PV Vmax:       1.25 m/s AV Area (Vmean):   1.18 cm      PV Peak grad:  6.2 mmHg AV Area (VTI):     0.97 cm AV Vmax:           190.00 cm/s AV Vmean:          128.000 cm/s AV VTI:            0.438 m AV Peak Grad:      14.4 mmHg AV Mean Grad:      8.0 mmHg LVOT Vmax:         113.00 cm/s LVOT Vmean:        75.100 cm/s LVOT VTI:          0.211 m LVOT/AV VTI ratio: 0.48  AORTA Ao Root diam: 2.20 cm Ao Asc diam:  2.80 cm MITRAL VALVE MV Area (PHT): 2.81 cm     SHUNTS MV Area VTI:   0.71 cm     Systemic VTI:  0.21 m MV Peak grad:  10.2 mmHg    Systemic Diam: 1.60 cm MV Mean grad:  2.0 mmHg MV Vmax:       1.60 m/s MV Vmean:      70.4 cm/s MV Decel Time: 270 msec MV E velocity: 85.90 cm/s MV A velocity: 112.00 cm/s MV E/A ratio:  0.77 Stanly Leavens MD Electronically signed by Stanly Leavens MD Signature Date/Time: 12/31/2023/12:08:02 PM    Final (Updated)    DG Pelvis Portable Result Date: 12/31/2023 CLINICAL DATA:  Fall while trying to use the bathroom this morning. EXAM: PORTABLE PELVIS 1-2 VIEWS COMPARISON:  None Available. 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 No evidence of femoral fracture. Subtle lucency involving over the left inferior pubic ramus which appears extend off the bone into the soft tissues likely overlapping soft tissue fat plane. No definite acute fracture. Degenerative changes of the spine. Small calcified uterine fibroid over the right pelvis. IMPRESSION: 1. No acute findings. 2. 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 Electronically Signed   By: Toribio Agreste M.D.   On: 12/31/2023 08:08   DG Knee Complete 4 Views Left Result Date: 12/31/2023 EXAM: 4 OR MORE VIEW(S) XRAY OF THE LEFT KNEE 12/31/2023 07:21:00 AM COMPARISON: None available. CLINICAL HISTORY: Fall. Reason for exam: fall. Per triage notes: Pt to ED via [facility]  from home. Pt fell while trying to use bathroom earlier this morning, states her legs gave out. Pt did lay on floor for approximately an hour before she was assisted up. Pt denies hitting anything and does not take blood thinners. Pt does have bilateral leg swelling for past week. Pt states her left knee is bad does not have pain. Pt A\T\Ox4 on arrival, NAD noted.  FINDINGS: BONES AND JOINTS: There is osteopenia without evidence of fracture, dislocation, or joint effusion. There is mild narrowing and spurring of the patellofemoral and medial femorotibial joints without evidence of erosive arthropathy or advanced arthrosis. No focal pathologic bone lesion is seen. SOFT TISSUES: There is generalized stranding edema in the visualized left lower extremity. Scattered calcific plaques of the superficial femoral artery. IMPRESSION: 1. No acute fracture or dislocation. 2. Generalized soft tissue edema in the visualized left lower extremity. 3. Osteopenia with Mild osteoarthrosis of the patellofemoral and medial femorotibial compartments. Electronically signed by: Francis Quam MD 12/31/2023 08:01 AM EDT RP Workstation: [id number]   DG Knee Complete 4 Views Right Result Date: 12/31/2023 EXAM: 4 OR MORE VIEW(S) XRAY OF THE RIGHT KNEE 12/31/2023 07:21:00 AM COMPARISON: None available. CLINICAL HISTORY: Fall. Reason for exam: fall. Per triage notes: Pt to ED via [facility] from home. Pt fell while trying to use bathroom earlier this morning, states her legs gave out. Pt did lay on floor for approximately an hour before she was assisted up. Pt denies hitting anything and does not take blood thinners. Pt does have bilateral leg swelling for past week. Pt states her left knee is 'bad' does not have pain. Pt A\T\Ox4 on arrival, NAD noted. FINDINGS: BONES AND JOINTS: There is no evidence of fracture, dislocation, or joint effusion. No focal osseous lesion. There is mild narrowing and spurring of the patellofemoral and medial  femorotibial joints without erosive arthropathy or advanced arthrosis. SOFT TISSUES: There is generalized subcutaneous stranding edema in the right lower extremity. IMPRESSION: 1. No acute fracture or dislocation. 2. Generalized subcutaneous stranding edema in the right lower extremity. 3. Mild degenerative changes of the patellofemoral and medial femorotibial joints. Electronically signed by: Francis Quam MD 12/31/2023 07:59 AM EDT RP Workstation: [id number]   CT Lumbar Spine Wo Contrast Result Date: 12/31/2023 EXAM: CT OF THE LUMBAR SPINE WITHOUT CONTRAST 12/31/2023 07:32:00 AM TECHNIQUE: CT of the lumbar spine was performed without the administration of intravenous contrast. Multiplanar reformatted images are provided for review. Automated exposure control, iterative reconstruction, and/or weight based adjustment of the mA/kV was utilized to reduce the radiation dose to as low as reasonably achievable. COMPARISON: None available. CLINICAL HISTORY: FINDINGS: BONES AND ALIGNMENT: Normal vertebral body heights. No acute fracture or suspicious bone lesion. Mild lumbar levoscoliosis, apex at L3-L4. Minimal grade 1 anterolisthesis at L4-L5. Lumbar segmentation is standard but with a partial right hemisacralization of L5. Generalized osteopenia. Mild lumbar spondylosis. The spinous process abutment with opposing surface spurring and cortical sclerosis at L3-L4 and L4-L5. The visualized SI joints are patent with vacuum phenomenon and mild spurring. DEGENERATIVE CHANGES: T10-T11 through L2-L3: Normal disc heights. No herniation or canal stenosis. At L2-L3, there is a mild generalized annular bulge but it is nonstenosing. The bilateral foramina are clear at these levels. There is trace facet spurring at L1-L2 and mild facet spurring at L2-L3. L3-L4: Moderate disc space loss with vacuum phenomenon and peridiscal endplate cortical thickening eccentric to the right. Circumferential disc osteophyte complex eccentric to  the right far lateral aspect. Dorsal ligamentous thickening. These cause partial effacement of both subarticular zones and mild to moderate spinal canal stenosis. Mild facet hypertrophy with unilateral mild right foraminal stenosis. L4-L5: Disc is normal in height. Broad-based posterior disc bulge with bulging continuing into the lower foramina. Dorsal ligamentous thickening. These cause mild to moderate spinal canal stenosis with effacement of the subarticular zones. Unilateral mild to moderate left foraminal stenosis. L5-S1: Slight disc space loss.  No substantial disc bulge. No herniation or stenosis. Moderate facet hypertrophy but foraminal stenosis is noted only on the left, where it is mild. SOFT TISSUES: No paravertebral mass, fluid collection or hematoma. VASCULATURE: Heavy aortoiliac calcific plaque. No AAA. LUNGS AND PLEURAL SPACES: Bilateral small layering pleural effusions as noted on portable chest. Visualized lung bases are otherwise clear. KIDNEYS, URETERS, AND BLADDER: No nephrolithiasis or hydronephrosis. IMPRESSION: 1. No evidence of acute traumatic injury. 2. Moderate disc space loss at L3-L4 with associated degenerative changes resulting in mild to moderate spinal canal stenosis and mild right foraminal stenosis. 3. Broad-based posterior disc bulge at L4-L5 with dorsal ligamentous thickening resulting in mild to moderate spinal canal stenosis and mild to moderate left foraminal stenosis. 4. Osteopenia and additional degenerative changes detailed above. Electronically signed by: Francis Quam MD 12/31/2023 07:57 AM EDT RP Workstation: [id number]   DG Chest Portable 1 View Result Date: 12/31/2023 EXAM: 1 VIEW(S) XRAY OF THE CHEST 12/31/2023 07:21:00 AM COMPARISON: PA and lateral chest 09/11/2012. CLINICAL HISTORY: fall. Reason for exam: fall ; Per triage notes: Pt to ED via [facility] from home. Pt fell while trying to use bathroom earlier this morning, states her legs gave out. Pt did lay on floor  for approximately an hour before she was assisted up. Pt denies hitting anything and does not take ; blood thinners. Pt does have bilateral leg swelling for past week. Pt states her left knee is bad does not have pain. Pt A\T\Ox4 on arrival, NAD noted. FINDINGS: LINES, TUBES AND DEVICES: There is an electrical pad overlying the left chest, multiple overlying monitor wires mostly on the right. LUNGS AND PLEURA: The lungs are hypoinflated with bronchovascular crowding in the bases but no convincing focal infiltrate. There are bilateral minimal pleural effusions. No pulmonary edema. No pneumothorax. HEART AND MEDIASTINUM: The heart is slightly enlarged. No vascular congestion is seen. The mediastinum is normally outlined. There is aortic atherosclerosis. BONES AND SOFT TISSUES: There is osteopenia and degenerative change of the spine. No acute osseous abnormality. IMPRESSION: 1. No acute cardiopulmonary process identified. 2. Cardiomegaly without vascular congestion. 3. Minimal  bilateral pleural effusions. 4. Hypoinflation with basilar bronchovascular crowding, without focal airspace disease. Electronically signed by: Francis Quam MD 12/31/2023 07:40 AM EDT RP Workstation: [id number]     Labs:   Basic Metabolic Panel: Recent Labs  Lab 12/31/23 0703 01/02/24 0216  NA 141 144  K 4.4 3.4*  CL 113* 113*  CO2 16* 20*  GLUCOSE 178* 98  BUN 28* 27*  CREATININE 1.38* 1.22*  CALCIUM  9.7 9.0   GFR Estimated Creatinine Clearance: 26.8 mL/min (A) (by C-G formula based on SCr of 1.22 mg/dL (H)). Liver Function Tests: No results for input(s): AST, ALT, ALKPHOS, BILITOT, PROT, ALBUMIN in the last 168 hours. No results for input(s): LIPASE, AMYLASE in the last 168 hours. No results for input(s): AMMONIA in the last 168 hours. Coagulation profile No results for input(s): INR, PROTIME in the last 168 hours.  CBC: Recent Labs  Lab 12/31/23 0703 01/02/24 0216  WBC 9.6 4.5   NEUTROABS  --  2.7  HGB 10.4* 10.6*  HCT 31.8* 31.9*  MCV 98.1 96.4  PLT 201 147*   Cardiac Enzymes: No results for input(s): CKTOTAL, CKMB, CKMBINDEX, TROPONINI in the last 168 hours. BNP: Invalid input(s): POCBNP CBG: Recent Labs  Lab 01/01/24 1203 01/01/24 1731 01/01/24 2123 01/02/24 1010 01/02/24 1150  GLUCAP 100* 127* 110* 93 155*   D-Dimer No results for input(s): DDIMER in the  last 72 hours. Hgb A1c Recent Labs    12/31/23 1132  HGBA1C 5.6   Lipid Profile No results for input(s): CHOL, HDL, LDLCALC, TRIG, CHOLHDL, LDLDIRECT in the last 72 hours. Thyroid  function studies Recent Labs    12/31/23 0703  TSH 5.277*   Anemia work up No results for input(s): VITAMINB12, FOLATE, FERRITIN, TIBC, IRON, RETICCTPCT in the last 72 hours. Microbiology Recent Results (from the past 240 hours)  Surgical PCR screen     Status: None   Collection Time: 01/01/24  6:20 AM   Specimen: Nasal Mucosa; Nasal Swab  Result Value Ref Range Status   MRSA, PCR NEGATIVE NEGATIVE Final   Staphylococcus aureus NEGATIVE NEGATIVE Final    Comment: (NOTE) The Xpert SA Assay (FDA approved for NASAL specimens in patients 32 years of age and older), is one component of a comprehensive surveillance program. It is not intended to diagnose infection nor to guide or monitor treatment. Performed at [facility], [address], [place], [place] [address]     Time coordinating discharge: 45 minutes  Signed: Antonietta Lansdowne  [facility] 01/02/2024, 2:05 PM

## 2024-01-02 NOTE — Evaluation (Signed)
 Occupational Therapy Evaluation Patient Details Name: Rhonda Singh MRN: [id number] DOB: May 29, 1930 Today's Date: 01/02/2024   History of Present Illness   89 yo female adm 12/31/23 after fall, complete heart block. 10/29 PPM implant. PMHx: DM II, HTN, thyroid  disease.     Clinical Impressions Pt presents with decline in function and safety with ADLs and ADL mobility with impaired strength, balance and endurance; L UE pacemaker precautions. PTA pt lives alone with daughter nearby. Pt reports that she was Ind with ADLs/selfcare, light home mgt, light meal prep, used a cane for mobility and has a tub seat in tub shower. Pt currently requires CGA standing at sink for grooming/hygiene, min A with LB ADLs, min A/CGA with STS, SPTs and walking with RW with verbal and visual cues for hand placement and L UE ROM/use restrictions. Pt's daughter present and and can stay with pt to assist her as needed. Pt's VSS. OT will follow acutely to maximize level of function and safety     If plan is discharge home, recommend the following:   A little help with bathing/dressing/bathroom;A little help with walking and/or transfers;Assistance with cooking/housework;Assist for transportation;Help with stairs or ramp for entrance     Functional Status Assessment   Patient has had a recent decline in their functional status and demonstrates the ability to make significant improvements in function in a reasonable and predictable amount of time.     Equipment Recommendations   Tub/shower bench;BSC/3in1     Recommendations for Other Services         Precautions/Restrictions   Precautions Precautions: Fall;ICD/Pacemaker Precaution/Restrictions Comments: pacemaker Restrictions Weight Bearing Restrictions Per Provider Order: Yes Other Position/Activity Restrictions: no pushing/pulling with L UE, No shoulder flexion >90 degrees. Ok to use RW     Mobility Bed Mobility                General bed mobility comments: pt sititng EOB upon arrival    Transfers Overall transfer level: Needs assistance Equipment used: Rolling walker (2 wheels), 1 person hand held assist Transfers: Sit to/from Stand, Bed to chair/wheelchair/BSC Sit to Stand: Min assist     Step pivot transfers: Contact guard assist     General transfer comment: min A initial STS from EOB, CGA  to and from commode, cues for hand placement, L UE restrictions      Balance Overall balance assessment: Needs assistance Sitting-balance support: No upper extremity supported, Feet supported Sitting balance-Leahy Scale: Good     Standing balance support: Bilateral upper extremity supported, Single extremity supported, During functional activity Standing balance-Leahy Scale: Poor                             ADL either performed or assessed with clinical judgement   ADL Overall ADL's : Needs assistance/impaired Eating/Feeding: Independent;Sitting   Grooming: Wash/dry hands;Wash/dry face;Contact guard assist;Standing   Upper Body Bathing: Set up;Supervision/ safety;Sitting   Lower Body Bathing: Minimal assistance   Upper Body Dressing : Set up;Supervision/safety;Sitting   Lower Body Dressing: Minimal assistance   Toilet Transfer: Minimal assistance;Contact guard assist;Ambulation;Rolling walker (2 wheels);BSC/3in1;Regular Toilet;Grab bars;Cueing for safety   Toileting- Clothing Manipulation and Hygiene: Minimal assistance;Sit to/from stand       Functional mobility during ADLs: Minimal assistance;Contact guard assist;Cueing for safety;Rolling walker (2 wheels)       Vision Baseline Vision/History: 1 Wears glasses Ability to See in Adequate Light: 0 Adequate Patient Visual Report: No change from baseline  Perception         Praxis         Pertinent Vitals/Pain Pain Assessment Pain Assessment: No/denies pain     Extremity/Trunk Assessment Upper Extremity  Assessment Upper Extremity Assessment: Defer to OT evaluation LUE Deficits / Details: pacemaker precautions   Lower Extremity Assessment Lower Extremity Assessment: Generalized weakness   Cervical / Trunk Assessment Cervical / Trunk Assessment: Normal   Communication Communication Communication: No apparent difficulties   Cognition Arousal: Alert Behavior During Therapy: WFL for tasks assessed/performed Cognition: No apparent impairments                               Following commands: Intact       Cueing  General Comments   Cueing Techniques: Verbal cues;Visual cues      Exercises     Shoulder Instructions      Home Living Family/patient expects to be discharged to:: Private residence Living Arrangements: Alone Available Help at Discharge: Family;Available 24 hours/day Type of Home: House Home Access: Stairs to enter [employer] of Steps: 5 Entrance Stairs-Rails: Right;Left Home Layout: One level     Bathroom Shower/Tub: Chief Strategy Officer: Handicapped height     Home Equipment: Production Assistant, Radio - single point          Prior Functioning/Environment Prior Level of Function : Independent/Modified Independent;Driving             Mobility Comments: uses cane ADLs Comments: Ind with ADL/selfcare    OT Problem List: Decreased strength;Decreased range of motion;Decreased activity tolerance;Impaired balance (sitting and/or standing);Decreased knowledge of precautions   OT Treatment/Interventions: Self-care/ADL training;Patient/family education;Balance training;Therapeutic activities;DME and/or AE instruction;Energy conservation      OT Goals(Current goals can be found in the care plan section)   Acute Rehab OT Goals Patient Stated Goal: go home OT Goal Formulation: With patient/family Time For Goal Achievement: 01/16/24 ADL Goals Pt Will Perform Grooming: with supervision;with set-up;with caregiver  independent in assisting Pt Will Perform Upper Body Bathing: with set-up;with modified independence;with caregiver independent in assisting Pt Will Perform Lower Body Bathing: with contact guard assist;with supervision;with caregiver independent in assisting Pt Will Perform Upper Body Dressing: with set-up;with modified independence;with caregiver independent in assisting Pt Will Perform Lower Body Dressing: with contact guard assist;with supervision;with caregiver independent in assisting Pt Will Transfer to Toilet: with supervision;with modified independence;ambulating Pt Will Perform Toileting - Clothing Manipulation and hygiene: with contact guard assist;with supervision;sitting/lateral leans;sit to/from stand;with caregiver independent in assisting   OT Frequency:  Min 2X/week    Co-evaluation              AM-PAC OT 6 Clicks Daily Activity     Outcome Measure Help from another person eating meals?: None Help from another person taking care of personal grooming?: A Little Help from another person toileting, which includes using toliet, bedpan, or urinal?: A Little Help from another person bathing (including washing, rinsing, drying)?: A Little Help from another person to put on and taking off regular upper body clothing?: A Little Help from another person to put on and taking off regular lower body clothing?: A Little 6 Click Score: 19   End of Session Equipment Utilized During Treatment: Gait belt;Rolling walker (2 wheels);Other (comment) (BSC)  Activity Tolerance: Patient tolerated treatment well Patient left: in bed;with family/visitor present;Other (comment) (sitting EOB, PT in to see pt)  OT Visit Diagnosis: Unsteadiness on feet (R26.81);Other abnormalities of  gait and mobility (R26.89);Muscle weakness (generalized) (M62.81)                Time: 8888-8862 OT Time Calculation (min): 26 min Charges:  OT General Charges $OT Visit: 1 Visit OT Evaluation $OT Eval  Moderate Complexity: 1 Mod OT Treatments $Therapeutic Activity: 8-22 mins    Jacques Karna Loose 01/02/2024, 1:16 PM

## 2024-01-02 NOTE — TOC Initial Note (Addendum)
 Transition of Care [facility]) - Initial/Assessment Note    Patient Details  Name: Rhonda Singh MRN: [id number] Date of Birth: 04-18-1930  [facility] CM/SW Contact:    Sudie Erminio Deems, RN Phone Number: 01/02/2024, 12:22 PM  Clinical Narrative: Patient presented for evaluation of heart block-post dual chamber PPM. PTA patient was independent from home and has support of daughter that is at the bedside. Patient has PCP and daughter takes her to all appointments. Patient has DME cane if needed in the home-Family has no DME preference- RW ordered via [employer] and it will be delivered to the room before discharge. Daughter to transport home via private vehicle.              DME bedside commode ordered via [employer]. Will deliver to the room.   Expected Discharge Plan: Home/Self Care Barriers to Discharge: No Barriers Identified   Patient Goals and CMS Choice Patient states their goals for this hospitalization and ongoing recovery are:: plan to return home once stable.          Expected Discharge Plan and Services   Discharge Planning Services: CM Consult Post Acute Care Choice: Durable Medical Equipment Living arrangements for the past 2 months: Single Family Home                 DME Arranged: Walker rolling DME Agency: [employer] Date DME Agency Contacted: 01/02/24 Time DME Agency Contacted: 1221 Representative spoke with at DME Agency: [place] HH Arranged: NA          Prior Living Arrangements/Services Living arrangements for the past 2 months: Single Family Home Lives with:: Self Patient language and need for interpreter reviewed:: Yes Do you feel safe going back to the place where you live?: Yes      Need for Family Participation in Patient Care: Yes (Comment) Care giver support system in place?: Yes (comment)   Criminal Activity/Legal Involvement Pertinent to Current Situation/Hospitalization: No - Comment as needed  Activities of  Daily Living   ADL Screening (condition at time of admission) Independently performs ADLs?: No Does the patient have a NEW difficulty with bathing/dressing/toileting/self-feeding that is expected to last >3 days?: Yes (Initiates electronic notice to provider for possible OT consult) Does the patient have a NEW difficulty with getting in/out of bed, walking, or climbing stairs that is expected to last >3 days?: Yes (Initiates electronic notice to provider for possible PT consult) Does the patient have a NEW difficulty with communication that is expected to last >3 days?: No Is the patient deaf or have difficulty hearing?: No Does the patient have difficulty seeing, even when wearing glasses/contacts?: No Does the patient have difficulty concentrating, remembering, or making decisions?: No  Permission Sought/Granted Permission sought to share information with : Family Supports, Magazine Features Editor, Case Estate Manager/land Agent granted to share information with : Yes, Verbal Permission Granted     Permission granted to share info w AGENCY: [employer]        Emotional Assessment Appearance:: Appears stated age Attitude/Demeanor/Rapport: Engaged Affect (typically observed): Appropriate Orientation: : Oriented to Self, Oriented to  Time, Oriented to Place Alcohol / Substance Use: Not Applicable Psych Involvement: No (comment)  Admission diagnosis:  Complete heart block (HCC) [I44.2] Heart block AV complete (HCC) [I44.2] Symptomatic bradycardia [R00.1] Accidental fall, initial encounter [T80.XXXA] Patient Active Problem List   Diagnosis Date Noted   Heart block AV complete (HCC) 12/31/2023   Complete heart block (HCC) 12/31/2023   Malignant  neoplasm of lower-outer quadrant of left breast of female, estrogen receptor positive (HCC) 12/20/2021   Age-related osteoporosis without current pathological fracture 06/24/2020   Lower urinary tract infectious disease 09/12/2012    Hypokalemia 09/12/2012   Dehydration 09/12/2012   Abnormal cardiac rate 09/12/2012   Sepsis (HCC) 09/11/2012   Hypotension arterial 09/11/2012   History of hypertension 09/11/2012   DM2 (diabetes mellitus, type 2) (HCC) 09/11/2012   Obesity 09/11/2012   Hypothyroidism 09/11/2012   Hyperlipidemia 09/11/2012   PCP:  [facility]:   [facility] [phone] - [place], [place] - [address] AT [facility] [address] [place] [address] Phone: [phone] Fax: [phone]     Social Drivers of Health (SDOH) Social History: SDOH Screenings   Food Insecurity: No Food Insecurity (12/31/2023)  Housing: Low Risk  (12/31/2023)  Transportation Needs: No Transportation Needs (12/31/2023)  Utilities: Not At Risk (12/31/2023)  Social Connections: Moderately Isolated (12/31/2023)  Tobacco Use: Low Risk  (12/31/2023)   SDOH Interventions:     Readmission Risk Interventions     No data to display

## 2024-01-03 DIAGNOSIS — I442 Atrioventricular block, complete: Secondary | ICD-10-CM | POA: Diagnosis not present

## 2024-01-03 MED FILL — Midazolam HCl Inj 2 MG/2ML (Base Equivalent): INTRAMUSCULAR | Qty: 1 | Status: AC

## 2024-01-08 ENCOUNTER — Telehealth: Payer: Self-pay

## 2024-01-08 NOTE — Telephone Encounter (Signed)
 Follow-up after same day discharge: Implant date: 10/29/202 MD: MCA Device: PPM Location: L chest    Wound check visit: 01/14/2024 90 day MD follow-up: 04/24/2024  Remote Transmission received:yes  Dressing/sling removed: n/a  Confirm OAC restart on: LVM for pt to call back   Please continue to monitor your cardiac device site for redness, swelling, and drainage. Call the device clinic at [phone] if you experience these symptoms, fever/chills, or have questions about your device.   Remote monitoring is used to monitor your cardiac device from home. This monitoring is scheduled every 91 days by our office. It allows us  to keep an eye on the functioning of your device to ensure it is working properly.

## 2024-01-13 ENCOUNTER — Ambulatory Visit: Payer: Medicare Other | Admitting: Hematology and Oncology

## 2024-01-14 ENCOUNTER — Ambulatory Visit: Attending: Cardiology | Admitting: *Deleted

## 2024-01-14 DIAGNOSIS — I442 Atrioventricular block, complete: Secondary | ICD-10-CM | POA: Diagnosis not present

## 2024-01-14 NOTE — Patient Instructions (Signed)

## 2024-01-15 LAB — CUP PACEART INCLINIC DEVICE CHECK
Date Time Interrogation Session: 20251111160000
Implantable Lead Connection Status: 753985
Implantable Lead Connection Status: 753985
Implantable Lead Implant Date: 20251029
Implantable Lead Implant Date: 20251029
Implantable Lead Location: 753859
Implantable Lead Location: 753860
Implantable Lead Model: 3830
Implantable Lead Model: 5076
Implantable Pulse Generator Implant Date: 20251029

## 2024-01-15 NOTE — Progress Notes (Signed)
 Normal dual chamber pacemaker wound check. Presenting rhythm: AS/VP. Wound well healed. Routine testing performed. Thresholds, sensing, and impedance consistent with implant measurements and at 3.5V safety margin/auto capture until 3 month visit. No episodes. Reviewed arm restrictions to continue for 6 weeks total post op.  Pt enrolled in remote follow-up.

## 2024-02-12 ENCOUNTER — Ambulatory Visit: Attending: Family Medicine

## 2024-02-13 LAB — CUP PACEART REMOTE DEVICE CHECK
Battery Remaining Longevity: 131 mo
Battery Voltage: 3.2 V
Brady Statistic AP VP Percent: 0.21 %
Brady Statistic AP VS Percent: 0 %
Brady Statistic AS VP Percent: 99.77 %
Brady Statistic AS VS Percent: 0.02 %
Brady Statistic RA Percent Paced: 0.21 %
Brady Statistic RV Percent Paced: 99.98 %
Date Time Interrogation Session: 20251210052708
Implantable Lead Connection Status: 753985
Implantable Lead Connection Status: 753985
Implantable Lead Implant Date: 20251029
Implantable Lead Implant Date: 20251029
Implantable Lead Location: 753859
Implantable Lead Location: 753860
Implantable Lead Model: 3830
Implantable Lead Model: 5076
Implantable Pulse Generator Implant Date: 20251029
Lead Channel Impedance Value: 266 Ohm
Lead Channel Impedance Value: 323 Ohm
Lead Channel Impedance Value: 342 Ohm
Lead Channel Impedance Value: 475 Ohm
Lead Channel Pacing Threshold Amplitude: 0.375 V
Lead Channel Pacing Threshold Amplitude: 0.75 V
Lead Channel Pacing Threshold Pulse Width: 0.4 ms
Lead Channel Pacing Threshold Pulse Width: 0.4 ms
Lead Channel Sensing Intrinsic Amplitude: 1.875 mV
Lead Channel Sensing Intrinsic Amplitude: 1.875 mV
Lead Channel Sensing Intrinsic Amplitude: 16.125 mV
Lead Channel Setting Pacing Amplitude: 3.5 V
Lead Channel Setting Pacing Amplitude: 3.5 V
Lead Channel Setting Pacing Pulse Width: 0.4 ms
Lead Channel Setting Sensing Sensitivity: 2.8 mV
Zone Setting Status: 755011
Zone Setting Status: 755011

## 2024-02-19 NOTE — Progress Notes (Signed)
 Remote PPM Transmission

## 2024-04-24 ENCOUNTER — Ambulatory Visit: Admitting: Student in an Organized Health Care Education/Training Program

## 2024-05-13 ENCOUNTER — Ambulatory Visit

## 2024-08-12 ENCOUNTER — Ambulatory Visit

## 2024-09-08 ENCOUNTER — Ambulatory Visit: Admitting: Hematology and Oncology

## 2024-11-11 ENCOUNTER — Ambulatory Visit

## 2025-02-10 ENCOUNTER — Ambulatory Visit
